# Patient Record
Sex: Female | Born: 1970 | Race: Black or African American | Hispanic: No | Marital: Married | Smoking: Never smoker
Health system: Southern US, Community
[De-identification: ages and names within clinical notes are randomized; demographics above are authoritative.]

## PROBLEM LIST (undated history)

## (undated) DIAGNOSIS — N390 Urinary tract infection, site not specified: Secondary | ICD-10-CM

## (undated) DIAGNOSIS — L820 Inflamed seborrheic keratosis: Secondary | ICD-10-CM

## (undated) HISTORY — DX: Inflamed seborrheic keratosis: L82.0

## (undated) HISTORY — PX: TUBAL LIGATION: SHX77

## (undated) HISTORY — DX: Urinary tract infection, site not specified: N39.0

---

## 2007-12-30 ENCOUNTER — Emergency Department (HOSPITAL_BASED_OUTPATIENT_CLINIC_OR_DEPARTMENT_OTHER): Admission: EM | Admit: 2007-12-30 | Discharge: 2007-12-31 | Payer: Self-pay | Admitting: Emergency Medicine

## 2009-08-07 IMAGING — CR DG CHEST 2V
2 series · 2 of 2 positions shown · non-contrast
Comparison: None.

CLINICAL DATA: Tachycardia.  Shortness of breath.  Dizziness.
Recent marijuana use.

CHEST - 2 VIEW

[w chest pa]
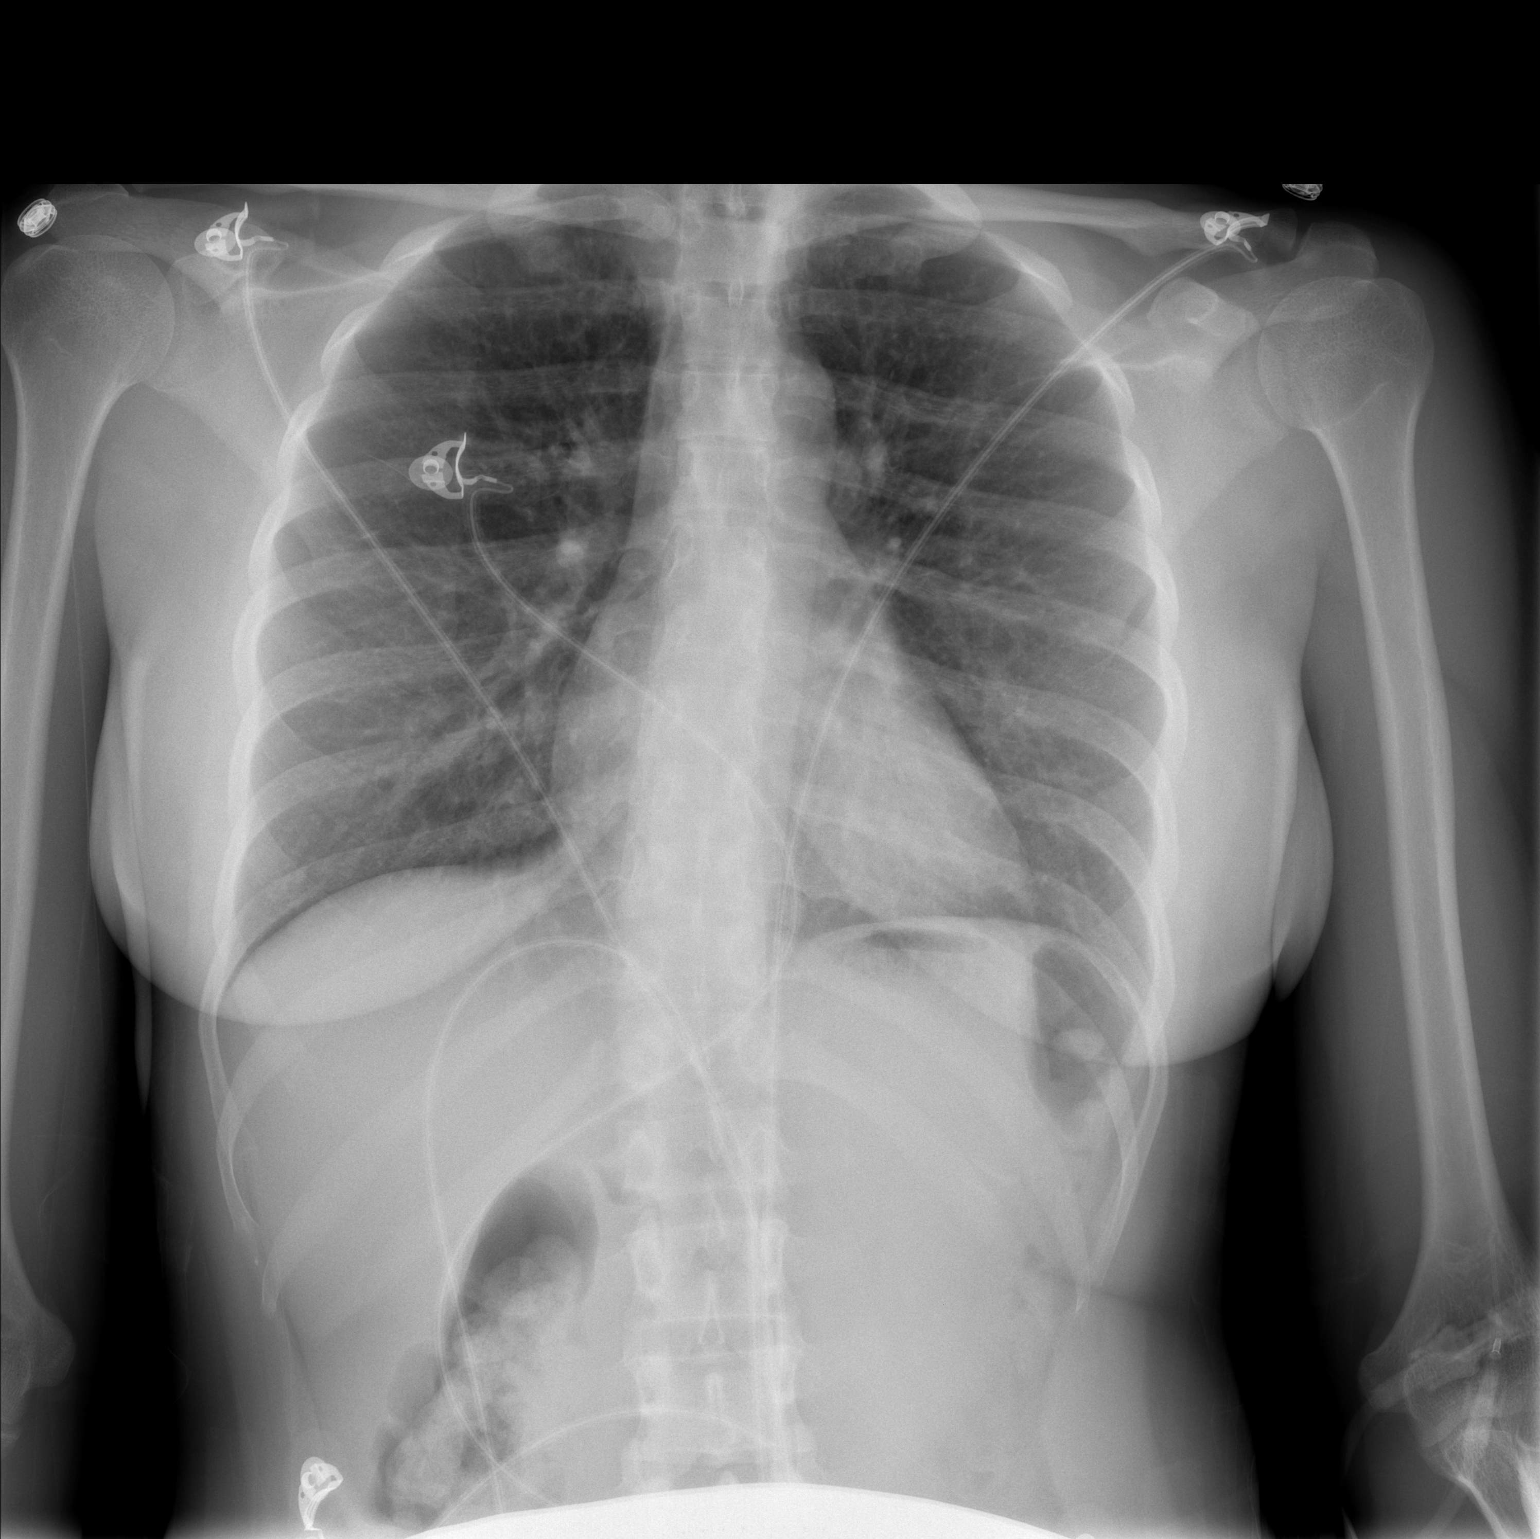

[w chest lat]
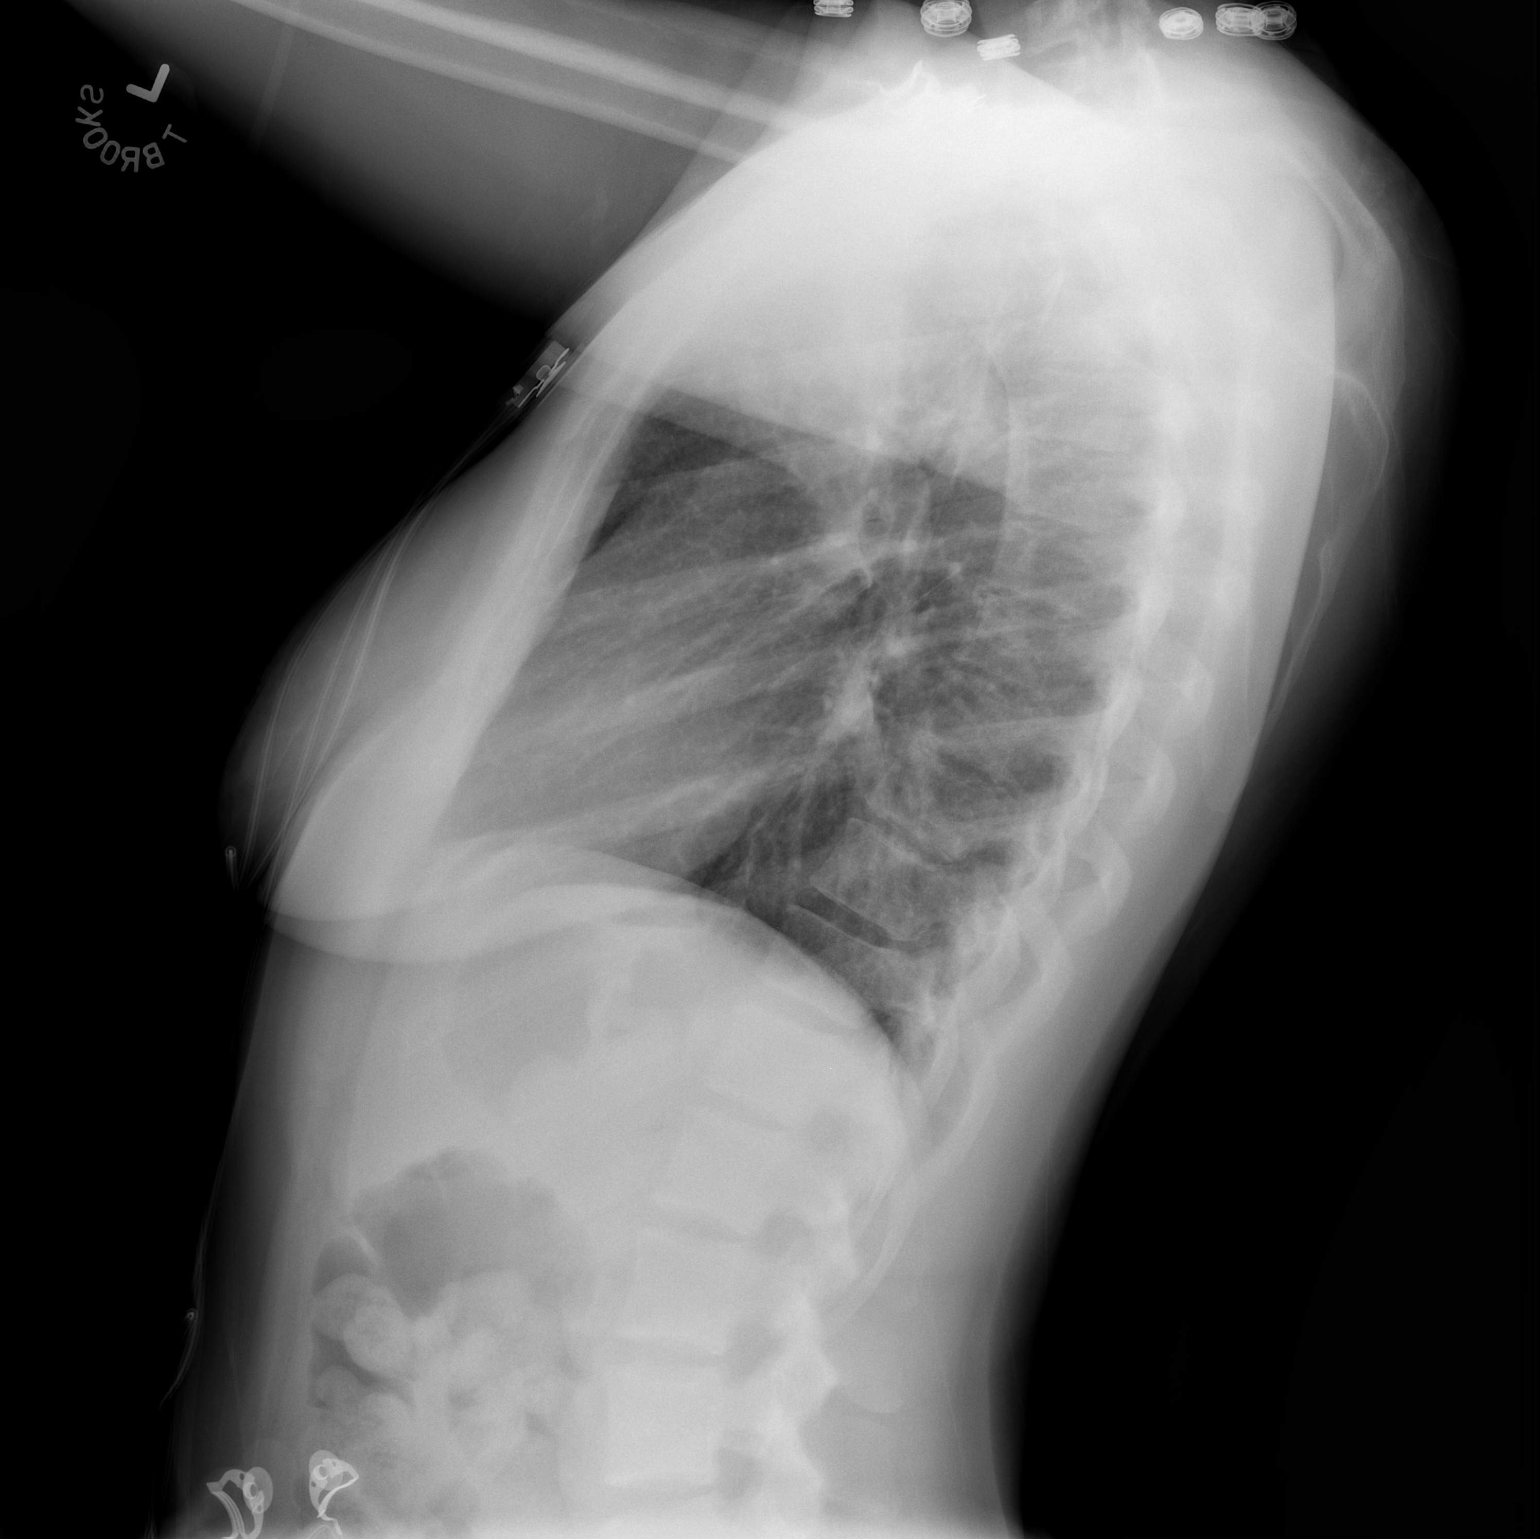

[2 of 2 positions shown; findings below may reference images not displayed]

FINDINGS: Normal sized heart.  Mild diffuse peribronchial
thickening without airspace consolidation.  Mild scoliosis.
IMPRESSION: Mild bronchitic changes.

## 2011-02-25 ENCOUNTER — Inpatient Hospital Stay (INDEPENDENT_AMBULATORY_CARE_PROVIDER_SITE_OTHER)
Admission: RE | Admit: 2011-02-25 | Discharge: 2011-02-25 | Disposition: A | Discharge status: Home / Self Care | Payer: No Typology Code available for payment source | Source: Ambulatory Visit | Attending: Family Medicine | Admitting: Family Medicine

## 2011-02-25 ENCOUNTER — Encounter: Payer: Self-pay | Admitting: Family Medicine

## 2011-02-25 DIAGNOSIS — R300 Dysuria: Secondary | ICD-10-CM

## 2011-02-25 DIAGNOSIS — N3000 Acute cystitis without hematuria: Secondary | ICD-10-CM

## 2011-02-25 LAB — CONVERTED CEMR LAB
Ketones, urine, test strip: NEGATIVE
Protein, U semiquant: NEGATIVE
Specific Gravity, Urine: 1.015
Urobilinogen, UA: 0.2
pH: 5.5

## 2011-04-07 NOTE — Progress Notes (Signed)
Summary: PAINFUL/BURNING URINATION...WSE (rm 2)   Vital Signs:  Patient Profile:   40 Years Old Female CC:      dysuria and heamturia x 3 days LMP:     01/29/2011 Height:     62 inches Weight:      132 pounds O2 Sat:      100 % O2 treatment:    Room Air Temp:     98.6 degrees F oral Pulse rate:   94 / minute Resp:     14 per minute BP sitting:   116 / 80  (left arm) Cuff size:   regular  Vitals Entered By: Lajean Saver RN (February 25, 2011 1:09 PM)  Menstrual History: LMP (date): 01/29/2011 LMP - Character: normal                  Updated Prior Medication List: No Medications Current Allergies: No known allergies History of Present Illness Chief Complaint: dysuria and heamturia x 3 days History of Present Illness:  Subjective:  Patient presents complaining of UTI symptoms for3 days.  Complains of dysuria, frequency, and urgency.  No nocturia.  Had one brief episode of hematuria.  No abnormal vaginal discharge.  No fever.  Had chills/sweats this morning.   No abdominal pain.  No flank pain.  No nausea/vomiting.  Last menstrual period normal.    REVIEW OF SYSTEMS Constitutional Symptoms      Denies fever, chills, night sweats, weight loss, weight gain, and fatigue.  Eyes       Denies change in vision, eye pain, eye discharge, glasses, contact lenses, and eye surgery. Ear/Nose/Throat/Mouth       Denies hearing loss/aids, change in hearing, ear pain, ear discharge, dizziness, frequent runny nose, frequent nose bleeds, sinus problems, sore throat, hoarseness, and tooth pain or bleeding.  Respiratory       Denies dry cough, productive cough, wheezing, shortness of breath, asthma, bronchitis, and emphysema/COPD.  Cardiovascular       Denies murmurs, chest pain, and tires easily with exhertion.    Gastrointestinal       Denies stomach pain, nausea/vomiting, diarrhea, constipation, blood in bowel movements, and indigestion. Genitourniary       Complains of painful  urination.      Denies blood or discharge from vagina, kidney stones, and loss of urinary control. Neurological       Denies paralysis, seizures, and fainting/blackouts. Musculoskeletal       Denies muscle pain, joint pain, joint stiffness, decreased range of motion, redness, swelling, muscle weakness, and gout.  Skin       Denies bruising, unusual mles/lumps or sores, and hair/skin or nail changes.  Psych       Denies mood changes, temper/anger issues, anxiety/stress, speech problems, depression, and sleep problems. Other Comments: Taken Azo   Past History:  Past Medical History: Unremarkable  Past Surgical History: Caesarean section  Social History: Never Smoked Alcohol use-yes Drug use-no Smoking Status:  never Drug Use:  no   Objective:  Appearance:  Patient appears healthy, stated age, and in no acute distress  Eyes:  Pupils are equal, round, and reactive to light and accomodation.  Extraocular movement is intact.  Conjunctivae are not inflamed.  Mouth:  moist mucous membranes  Neck:  Supple.  No adenopathy is present Lungs:  Clear to auscultation.  Breath sounds are equal.  Heart:  Regular rate and rhythm without murmurs, rubs, or gallops.  Abdomen:  Nontender without masses or hepatosplenomegaly.  Bowel sounds are present.  No CVA or flank tenderness.  urinalysis (dipstick): trace blood, + nit, 2+ leuks Assessment New Problems: DYSURIA (ICD-788.1) ACUTE CYSTITIS (ICD-595.0)   Plan New Medications/Changes: SULFAMETHOXAZOLE-TMP DS 800-160 MG TABS (SULFAMETHOXAZOLE-TRIMETHOPRIM) One by mouth two times a day  #10 x 0, 02/25/2011, Donna Christen MD  New Orders: Urinalysis [CPT-81003] T-Culture, Urine [[id number]] New Patient Level III [09811] Planning Comments:   Urine culture pending.  Begin Septra DS for 5 days.  May continue Azo for two days.  Increase fluids Return for worsening symptoms or failure to improve   The patient and/or caregiver has been  counseled thoroughly with regard to medications prescribed including dosage, schedule, interactions, rationale for use, and possible side effects and they verbalize understanding.  Diagnoses and expected course of recovery discussed and will return if not improved as expected or if the condition worsens. Patient and/or caregiver verbalized understanding.  Prescriptions: SULFAMETHOXAZOLE-TMP DS 800-160 MG TABS (SULFAMETHOXAZOLE-TRIMETHOPRIM) One by mouth two times a day  #10 x 0   Entered and Authorized by:   Donna Christen MD   Signed by:   Donna Christen MD on 02/25/2011   Method used:   Print then Give to Patient   RxID:   [id number]   Orders Added: 1)  Urinalysis [CPT-81003] 2)  T-Culture, Urine [[id number]] 3)  New Patient Level III [99203]    Laboratory Results   Urine Tests  Date/Time Received: February 25, 2011 1:27 PM  Date/Time Reported: February 25, 2011 1:27 PM   Routine Urinalysis   Color: orange Appearance: Cloudy Glucose: trace   (Normal Range: Negative) Bilirubin: negative   (Normal Range: Negative) Ketone: negative   (Normal Range: Negative) Spec. Gravity: 1.015   (Normal Range: 1.003-1.035) Blood: trace-lysed   (Normal Range: Negative) pH: 5.5   (Normal Range: 5.0-8.0) Protein: negative   (Normal Range: Negative) Urobilinogen: 0.2   (Normal Range: 0-1) Nitrite: positive   (Normal Range: Negative) Leukocyte Esterace: 2+   (Normal Range: Negative)    Comments: Taken Azo     Appended Document: PAINFUL/BURNING URINATION...WSE (rm 2) Urine culture cancelled per patient request

## 2014-03-25 ENCOUNTER — Encounter: Payer: Self-pay | Admitting: *Deleted

## 2014-03-25 ENCOUNTER — Emergency Department (INDEPENDENT_AMBULATORY_CARE_PROVIDER_SITE_OTHER)
Admission: EM | Admit: 2014-03-25 | Discharge: 2014-03-25 | Disposition: A | Discharge status: Home / Self Care | Payer: No Typology Code available for payment source | Source: Home / Self Care | Attending: Family Medicine | Admitting: Family Medicine

## 2014-03-25 DIAGNOSIS — N3001 Acute cystitis with hematuria: Secondary | ICD-10-CM

## 2014-03-25 LAB — POCT URINALYSIS DIP (MANUAL ENTRY)
Bilirubin, UA: NEGATIVE
Glucose, UA: 100
Ketones, POC UA: NEGATIVE
NITRITE UA: POSITIVE
PH UA: 5.5
Spec Grav, UA: 1.01
UROBILINOGEN UA: 1

## 2014-03-25 MED ORDER — SULFAMETHOXAZOLE-TRIMETHOPRIM 800-160 MG PO TABS: 2.0000 | ORAL_TABLET | Freq: Two times a day (BID) | ORAL | Status: DC

## 2014-03-25 NOTE — ED Notes (Signed)
Pt states she started having burning urination with urgency and frequency last night.  She has taken AZO for pain.

## 2014-03-25 NOTE — ED Provider Notes (Signed)
Shardai Star is a 43 y.o. female who presents to Urgent Care today for urinary urgency frequency and dysuria. Symptoms present starting yesterday evening. Patient denies any vaginal discharge fevers chills nausea vomiting or diarrhea. Her current symptoms are consistent with prior episodes of urinary tract infection. She notes previously she has done well with Bactrim and would like to use this medication today if possible.   History reviewed. No pertinent past medical history. Past Surgical History  Procedure Laterality Date  . Cesarean section     History  Substance Use Topics  . Smoking status: Never Smoker   . Smokeless tobacco: Never Used  . Alcohol Use: Yes   ROS as above Medications: No current facility-administered medications for this encounter.   Current Outpatient Prescriptions  Medication Sig Dispense Refill  . phenazopyridine (PYRIDIUM) 95 MG tablet Take 95 mg by mouth 3 (three) times daily as needed for pain.    Marland Kitchen sulfamethoxazole-trimethoprim (SEPTRA DS) 800-160 MG per tablet Take 2 tablets by mouth 2 (two) times daily. 28 tablet 0   No Known Allergies   Exam:  BP 125/85 mmHg  Pulse 95  Temp(Src) 98 F (36.7 C) (Oral)  Ht 5\' 2"  (1.575 m)  Wt 145 lb 8 oz (65.998 kg)  BMI 26.61 kg/m2  SpO2 100%  LMP 03/14/2014 Gen: Well NAD HEENT: ,  MMM Lungs: Normal work of breathing. CTABL Heart: RRR no MRG Abd: NABS, Soft. Nondistended, Nontender no CV angle tenderness to percussion Exts: Brisk capillary refill, warm and well perfused.   Results for orders placed or performed during the hospital encounter of 03/25/14 (from the past 24 hour(s))  POCT urinalysis dipstick (new)     Status: None   Collection Time: 03/25/14  2:34 PM  Result Value Ref Range   Color, UA orange    Clarity, UA cloudy    Glucose, UA =100    Bilirubin, UA negative    Bilirubin, UA negative    Spec Grav, UA 1.010    Blood, UA trace-intact    pH, UA 5.5    Protein Ur, POC trace    Urobilinogen, UA 1.0    Nitrite, UA Positive    Leukocytes, UA large (3+)    No results found.  Assessment and Plan: 43 y.o. female with urinary tract infection. Culture pending. Treatment with Bactrim.  Discussed warning signs or symptoms. Please see discharge instructions. Patient expresses understanding.     Gregor Hams, MD 03/25/14 [phone]

## 2014-03-25 NOTE — Discharge Instructions (Signed)
Thank you for coming in today. Make sure to use backup birth control with taking this medication   Urinary Tract Infection Urinary tract infections (UTIs) can develop anywhere along your urinary tract. Your urinary tract is your body's drainage system for removing wastes and extra water. Your urinary tract includes two kidneys, two ureters, a bladder, and a urethra. Your kidneys are a pair of bean-shaped organs. Each kidney is about the size of your fist. They are located below your ribs, one on each side of your spine. CAUSES Infections are caused by microbes, which are microscopic organisms, including fungi, viruses, and bacteria. These organisms are so small that they can only be seen through a microscope. Bacteria are the microbes that most commonly cause UTIs. SYMPTOMS  Symptoms of UTIs may vary by age and gender of the patient and by the location of the infection. Symptoms in young women typically include a frequent and intense urge to urinate and a painful, burning feeling in the bladder or urethra during urination. Older women and men are more likely to be tired, shaky, and weak and have muscle aches and abdominal pain. A fever may mean the infection is in your kidneys. Other symptoms of a kidney infection include pain in your back or sides below the ribs, nausea, and vomiting. DIAGNOSIS To diagnose a UTI, your caregiver will ask you about your symptoms. Your caregiver also will ask to provide a urine sample. The urine sample will be tested for bacteria and white blood cells. White blood cells are made by your body to help fight infection. TREATMENT  Typically, UTIs can be treated with medication. Because most UTIs are caused by a bacterial infection, they usually can be treated with the use of antibiotics. The choice of antibiotic and length of treatment depend on your symptoms and the type of bacteria causing your infection. HOME CARE INSTRUCTIONS  If you were prescribed antibiotics, take  them exactly as your caregiver instructs you. Finish the medication even if you feel better after you have only taken some of the medication.  Drink enough water and fluids to keep your urine clear or pale yellow.  Avoid caffeine, tea, and carbonated beverages. They tend to irritate your bladder.  Empty your bladder often. Avoid holding urine for long periods of time.  Empty your bladder before and after sexual intercourse.  After a bowel movement, women should cleanse from front to back. Use each tissue only once. SEEK MEDICAL CARE IF:   You have back pain.  You develop a fever.  Your symptoms do not begin to resolve within 3 days. SEEK IMMEDIATE MEDICAL CARE IF:   You have severe back pain or lower abdominal pain.  You develop chills.  You have nausea or vomiting.  You have continued burning or discomfort with urination. MAKE SURE YOU:   Understand these instructions.  Will watch your condition.  Will get help right away if you are not doing well or get worse. Document Released: 01/29/2005 Document Revised: 10/21/2011 Document Reviewed: 05/30/2011 [facility] Patient Information 2015 [place], [place]. This information is not intended to replace advice given to you by your health care provider. Make sure you discuss any questions you have with your health care provider.

## 2014-03-28 ENCOUNTER — Telehealth (HOSPITAL_COMMUNITY): Payer: Self-pay | Admitting: Family Medicine

## 2014-03-28 LAB — URINE CULTURE

## 2014-03-28 MED ORDER — FLUCONAZOLE 150 MG PO TABS: 150.0000 mg | ORAL_TABLET | Freq: Once | ORAL | Status: DC

## 2014-03-28 MED ORDER — CEPHALEXIN 500 MG PO CAPS: 500.0000 mg | ORAL_CAPSULE | Freq: Three times a day (TID) | ORAL | Status: DC

## 2014-03-28 NOTE — ED Notes (Signed)
Urine culture shows Escherichia coli resistant to Bactrim. Keflex called in. Patient notified.  Gregor Hams, MD 03/28/14 [phone]

## 2014-07-14 LAB — HM PAP SMEAR: HM PAP: NEGATIVE

## 2016-12-30 DIAGNOSIS — N390 Urinary tract infection, site not specified: Secondary | ICD-10-CM

## 2016-12-30 HISTORY — DX: Urinary tract infection, site not specified: N39.0

## 2017-03-16 ENCOUNTER — Ambulatory Visit (INDEPENDENT_AMBULATORY_CARE_PROVIDER_SITE_OTHER): Payer: 59 | Admitting: Physician Assistant

## 2017-03-16 ENCOUNTER — Encounter: Payer: Self-pay | Admitting: Physician Assistant

## 2017-03-16 VITALS — BP 115/77 | HR 85 | Ht 62.0 in | Wt 147.0 lb

## 2017-03-16 DIAGNOSIS — Z0000 Encounter for general adult medical examination without abnormal findings: Secondary | ICD-10-CM

## 2017-03-16 DIAGNOSIS — Z1321 Encounter for screening for nutritional disorder: Secondary | ICD-10-CM

## 2017-03-16 DIAGNOSIS — E559 Vitamin D deficiency, unspecified: Secondary | ICD-10-CM | POA: Diagnosis not present

## 2017-03-16 DIAGNOSIS — L820 Inflamed seborrheic keratosis: Secondary | ICD-10-CM

## 2017-03-16 DIAGNOSIS — Z7689 Persons encountering health services in other specified circumstances: Secondary | ICD-10-CM | POA: Diagnosis not present

## 2017-03-16 DIAGNOSIS — Z13220 Encounter for screening for lipoid disorders: Secondary | ICD-10-CM

## 2017-03-16 DIAGNOSIS — Z130 Encounter for screening for diseases of the blood and blood-forming organs and certain disorders involving the immune mechanism: Secondary | ICD-10-CM | POA: Diagnosis not present

## 2017-03-16 DIAGNOSIS — Z1329 Encounter for screening for other suspected endocrine disorder: Secondary | ICD-10-CM

## 2017-03-16 DIAGNOSIS — D225 Melanocytic nevi of trunk: Secondary | ICD-10-CM

## 2017-03-16 DIAGNOSIS — Z1331 Encounter for screening for depression: Secondary | ICD-10-CM | POA: Diagnosis not present

## 2017-03-16 DIAGNOSIS — Z1231 Encounter for screening mammogram for malignant neoplasm of breast: Secondary | ICD-10-CM

## 2017-03-16 DIAGNOSIS — K5900 Constipation, unspecified: Secondary | ICD-10-CM | POA: Diagnosis not present

## 2017-03-16 HISTORY — DX: Inflamed seborrheic keratosis: L82.0

## 2017-03-16 NOTE — Progress Notes (Signed)
HPI:                                                                Lauren Walsh is a 46 y.o. female who presents to [facility] today to establish care  Current concerns include: none  Past Medical History:  Diagnosis Date  . Recurrent UTI 12/30/2016   Past Surgical History:  Procedure Laterality Date  . CESAREAN SECTION    . TUBAL LIGATION     Social History   Tobacco Use  . Smoking status: Never Smoker  . Smokeless tobacco: Never Used  Substance Use Topics  . Alcohol use: Yes    Alcohol/week: 0.6 oz    Types: 1 Glasses of wine per week   family history includes Hypertension in her mother; Prostate cancer in her father.  ROS: Review of Systems  Constitutional: Positive for malaise/fatigue.  HENT: Negative.   Eyes: Positive for blurred vision (wears corrective lenses).  Respiratory: Negative.   Cardiovascular: Negative.   Gastrointestinal: Positive for constipation.  Genitourinary:       + recurrent UTI  Musculoskeletal: Negative.   Skin: Negative.   Neurological: Negative.   Endo/Heme/Allergies: Negative.   Psychiatric/Behavioral: Negative.      Medications: No current outpatient medications on file.   No current facility-administered medications for this visit.    No Known Allergies     Objective:  BP 115/77   Pulse 85   Ht 5\' 2"  (1.575 m)   Wt 147 lb (66.7 kg)   LMP 03/10/2017   BMI 26.89 kg/m  General Appearance:  Alert, cooperative, no distress, appropriate for age                            Head:  Normocephalic, without obvious abnormality                             Eyes:  PERRL, EOM's intact, conjunctiva and cornea clear, wearing contact lenses                             Ears:  TM pearly gray color and semitransparent, external ear canals normal, both ears                            Nose:  Nares symmetrical                          Throat:  Lips, tongue, and mucosa are moist, pink, and  intact; good dentition, wearing invisalign braces                             Neck:  Supple; symmetrical, trachea midline, no adenopathy; thyroid: no enlargement, symmetric, no tenderness/mass/nodules                             Back:  Symmetrical, no curvature, ROM normal  Chest/Breast:  deferred                           Lungs:  Clear to auscultation bilaterally, respirations unlabored                             Heart:  regular rate & normal rhythm, S1 and S2 normal, no murmurs, rubs, or gallops                     Abdomen:  Soft, non-tender, no mass or organomegaly              Genitourinary:  deferred         Musculoskeletal:  Tone and strength strong and symmetrical, all extremities; no joint pain or edema, normal gait and station                                       Lymphatic:  No adenopathy             Skin/Hair/Nails:  Skin warm, dry and intact, no rashes, scaly hyperpigmented nevus measuring approximately 0.5 cm on left upper back                    Neurologic:  Alert and oriented x3, no cranial nerve deficits, DTR's intact, sensation grossly intact, normal gait and station, no tremor Psych: well-groomed, cooperative, good eye contact, euthymic mood, affect mood-congruent, speech is articulate, and thought processes clear and goal-directed   Depression screen [facility] 2/9 03/16/2017  Decreased Interest 2  Down, Depressed, Hopeless 1  PHQ - 2 Score 3  Altered sleeping 2  Tired, decreased energy 3  Change in appetite 3  Feeling bad or failure about yourself  0  Trouble concentrating 0  Moving slowly or fidgety/restless 0  Suicidal thoughts 0  PHQ-9 Score 11     No results found for this or any previous visit (from the past 72 hour(s)). No results found.    Assessment and Plan: 46 y.o. female with   1. Encounter to establish care - reviewed PMH, PSH, PFH, medications and allergies  2. Breast cancer screening by mammogram - MM SCREENING BREAST TOMO BILATERAL;  Future  3. Encounter for annual physical exam - Pap smear UTD per patient, requesting records from [place] - mammogram ordered - Tdap UTD per patient - declined influenza - positive depression screening - declines STI screening - fasting labs per orders - CBC - Vitamin B12 - Lipid Panel w/reflex Direct LDL - TSH - VITAMIN D 25 Hydroxy (Vit-D Deficiency, Fractures)  4. Screening for blood disease - CBC - Ferritin  5. Encounter for vitamin deficiency screening - Vitamin B12 - VITAMIN D 25 Hydroxy (Vit-D Deficiency, Fractures)  6. Screening for thyroid disorder - TSH  7. Screening for lipid disorders - Lipid Panel w/reflex Direct LDL  8. Constipation, unspecified constipation type - high fiber diet, increase water intake, regular exercise, follow-up in 2 months  9. Positive depression screening - PHQ9 score 11, no acute safety issues - patient denies hx of depression. Reports she does not feel depressed, but does endorse fatigue. Recommended follow-up in 1-4 weeks to address in more detail  10. Melanocytic nevus of trunk - no evidence of atypia, monitor ABCD's, annual skin exam  Patient education and anticipatory guidance given  Patient agrees with treatment plan Follow-up in 1 year for CPE with fasting labs or sooner as needed if symptoms worsen or fail to improve  Darlyne Russian PA-C

## 2017-03-16 NOTE — Patient Instructions (Addendum)
I have also ordered fasting labs. The lab is a walk-in open M-F 7:30a-4:30p (closed 12:30-1:30p). Nothing to eat or drink after midnight or at least 8 hours before your blood draw. You can have water and your medications.    Physical Activity Recommendations for modifying lipids and lowering blood pressure Engage in aerobic physical activity to reduce LDL-cholesterol, non-HDL-cholesterol, and blood pressure  Frequency: 3-4 sessions per week  Intensity: moderate to vigorous  Duration: 40 minutes on average  Physical Activity Recommendations for secondary prevention 1. Aerobic exercise  Frequency: 3-5 sessions per week  Intensity: 50-80% capacity  Duration: 20 - 60 minutes  Examples: walking, treadmill, cycling, rowing, stair climbing, and arm/leg ergometry  2. Resistance exercise  Frequency: 2-3 sessions per week  Intensity: 10-15 repetitions/set to moderate fatigue  Duration: 1-3 sets of 8-10 upper and lower body exercises  Examples: calisthenics, elastic bands, cuff/hand weights, dumbbels, free weights, wall pulleys, and weight machines  Heart-Healthy Lifestyle  Eating a diet rich in vegetables, fruits and whole grains: also includes low-fat dairy products, poultry, fish, legumes, and nuts; limit intake of sweets, sugar-sweetened beverages and red meats  Getting regular exercise  Maintaining a healthy weight  Not smoking or getting help quitting  Staying on top of your health; for some people, lifestyle changes alone may not be enough to prevent a heart attack or stroke. In these cases, taking a statin at the right dose will most likely be necessary    High-Fiber Diet Fiber, also called dietary fiber, is a type of carbohydrate found in fruits, vegetables, whole grains, and beans. A high-fiber diet can have many health benefits. Your health care provider may recommend a high-fiber diet to help:  Prevent constipation. Fiber can make your bowel movements more  regular.  Lower your cholesterol.  Relieve hemorrhoids, uncomplicated diverticulosis, or irritable bowel syndrome.  Prevent overeating as part of a weight-loss plan.  Prevent heart disease, type 2 diabetes, and certain cancers.  What is my plan? The recommended daily intake of fiber includes:  38 grams for men under age 73.  30 grams for men over age 67.  65 grams for women under age 43.  90 grams for women over age 13.  You can get the recommended daily intake of dietary fiber by eating a variety of fruits, vegetables, grains, and beans. Your health care provider may also recommend a fiber supplement if it is not possible to get enough fiber through your diet. What do I need to know about a high-fiber diet?  Fiber supplements have not been widely studied for their effectiveness, so it is better to get fiber through food sources.  Always check the fiber content on thenutrition facts label of any prepackaged food. Look for foods that contain at least 5 grams of fiber per serving.  Ask your dietitian if you have questions about specific foods that are related to your condition, especially if those foods are not listed in the following section.  Increase your daily fiber consumption gradually. Increasing your intake of dietary fiber too quickly may cause bloating, cramping, or gas.  Drink plenty of water. Water helps you to digest fiber. What foods can I eat? Grains Whole-grain breads. Multigrain cereal. Oats and oatmeal. Brown rice. Barley. Bulgur wheat. [place]. Bran muffins. Popcorn. Rye wafer crackers. Vegetables Sweet potatoes. Spinach. Kale. Artichokes. Cabbage. Broccoli. Green peas. Carrots. Squash. Fruits Berries. Pears. Apples. Oranges. Avocados. Prunes and raisins. Dried figs. Meats and Other Protein Sources Navy, kidney, pinto, and soy beans. Split  peas. Lentils. Nuts and seeds. Dairy Fiber-fortified yogurt. Beverages Fiber-fortified soy milk. Fiber-fortified  orange juice. Other Fiber bars. The items listed above may not be a complete list of recommended foods or beverages. Contact your dietitian for more options. What foods are not recommended? Grains White bread. Pasta made with refined flour. White rice. Vegetables Fried potatoes. Canned vegetables. Well-cooked vegetables. Fruits Fruit juice. Cooked, strained fruit. Meats and Other Protein Sources Fatty cuts of meat. Fried Sales executive or fried fish. Dairy Milk. Yogurt. Cream cheese. Sour cream. Beverages Soft drinks. Other Cakes and pastries. Butter and oils. The items listed above may not be a complete list of foods and beverages to avoid. Contact your dietitian for more information. What are some tips for including high-fiber foods in my diet?  Eat a wide variety of high-fiber foods.  Make sure that half of all grains consumed each day are whole grains.  Replace breads and cereals made from refined flour or white flour with whole-grain breads and cereals.  Replace white rice with brown rice, bulgur wheat, or millet.  Start the day with a breakfast that is high in fiber, such as a cereal that contains at least 5 grams of fiber per serving.  Use beans in place of meat in soups, salads, or pasta.  Eat high-fiber snacks, such as berries, raw vegetables, nuts, or popcorn. This information is not intended to replace advice given to you by your health care provider. Make sure you discuss any questions you have with your health care provider. Document Released: 04/21/2005 Document Revised: 09/27/2015 Document Reviewed: 10/04/2013 Elsevier Interactive Patient Education  2017 [employer].   [facility] Content in Foods See the following list for the dietary fiber content of some common foods. High-fiber foods High-fiber foods contain 4 grams or more (4g or more) of fiber per serving. They include:  Artichoke (fresh) - 1 medium has 10.3g of fiber.  Baked beans, plain or vegetarian  (canned) -  cup has 5.2g of fiber.  Blackberries or raspberries (fresh) -  cup has 4g of fiber.  Bran cereal -  cup has 8.6g of fiber.  Bulgur (cooked) -  cup has 4g of fiber.  Kidney beans (canned) -  cup has 6.8g of fiber.  Lentils (cooked) -  cup has 7.8g of fiber.  Pear (fresh) - 1 medium has 5.1g of fiber.  Peas (frozen) -  cup has 4.4g of fiber.  Pinto beans (canned) -  cup has 5.5g of fiber.  Pinto beans (dried and cooked) -  cup has 7.7g of fiber.  Potato with skin (baked) - 1 medium has 4.4g of fiber.  Quinoa (cooked) -  cup has 5g of fiber.  Soybeans (canned, frozen, or fresh) -  cup has 5.1g of fiber.  Moderate-fiber foods Moderate-fiber foods contain 1-4 grams (1-4g) of fiber per serving. They include:  Almonds - 1 oz. has 3.5g of fiber.  Apple with skin - 1 medium has 3.3g of fiber.  Applesauce, sweetened -  cup has 1.5g of fiber.  Bagel, plain - one 4-inch (10-cm) bagel has 2g of fiber.  Banana - 1 medium has 3.1g of fiber.  Broccoli (cooked) -  cup has 2.5g of fiber.  Carrots (cooked) -  cup has 2.3g of fiber.  Corn (canned or frozen) -  cup has 2.1g of fiber.  Corn tortilla - one 6-inch (15-cm) tortilla has 1.5g of fiber.  Green beans (canned) -  cup has 2g of fiber.  Instant oatmeal -  cup has  about 2g of fiber.  Long-grain brown rice (cooked) - 1 cup has 3.5g of fiber.  Macaroni, enriched (cooked) - 1 cup has 2.5g of fiber.  Melon - 1 cup has 1.4g of fiber.  Multigrain cereal -  cup has about 2-4g of fiber.  Orange - 1 small has 3.1g of fiber.  Potatoes, mashed -  cup has 1.6g of fiber.  Raisins - 1/4 cup has 1.6g of fiber.  Squash -  cup has 2.9g of fiber.  Sunflower seeds -  cup has 1.1g of fiber.  Tomato - 1 medium has 1.5g of fiber.  Vegetable or soy patty - 1 has 3.4g of fiber.  Whole-wheat bread - 1 slice has 2g of fiber.  Whole-wheat spaghetti -  cup has 3.2g of fiber.  Low-fiber  foods Low-fiber foods contain less than 1 gram (less than 1g) of fiber per serving. They include:  Egg - 1 large.  Flour tortilla - one 6-inch (15-cm) tortilla.  Fruit juice -  cup.  Lettuce - 1 cup.  Meat, poultry, or fish - 1 oz.  Milk - 1 cup.  Spinach (raw) - 1 cup.  White bread - 1 slice.  White rice -  cup.  Yogurt -  cup.  Actual amounts of fiber in foods may be different depending on processing. Talk with your dietitian about how much fiber you need in your diet. This information is not intended to replace advice given to you by your health care provider. Make sure you discuss any questions you have with your health care provider. Document Released: 09/07/2006 Document Revised: 09/27/2015 Document Reviewed: 06/14/2015 Elsevier Interactive Patient Education  2018 [employer].

## 2017-03-17 DIAGNOSIS — E559 Vitamin D deficiency, unspecified: Secondary | ICD-10-CM | POA: Insufficient documentation

## 2017-03-17 LAB — CBC
HEMATOCRIT: 37.8 % (ref 35.0–45.0)
HEMOGLOBIN: 12.1 g/dL (ref 11.7–15.5)
MCH: 27.1 pg (ref 27.0–33.0)
MCHC: 32 g/dL (ref 32.0–36.0)
MCV: 84.8 fL (ref 80.0–100.0)
MPV: 10.9 fL (ref 7.5–12.5)
Platelets: 283 10*3/uL (ref 140–400)
RBC: 4.46 10*6/uL (ref 3.80–5.10)
RDW: 12.1 % (ref 11.0–15.0)
WBC: 7.4 10*3/uL (ref 3.8–10.8)

## 2017-03-17 LAB — LIPID PANEL W/REFLEX DIRECT LDL
CHOL/HDL RATIO: 2.8 (calc) (ref <5.0)
Cholesterol: 189 mg/dL (ref <200)
HDL: 68 mg/dL (ref >50)
LDL CHOLESTEROL (CALC): 102 mg/dL — AB
NON-HDL CHOLESTEROL (CALC): 121 mg/dL (ref <130)
Triglycerides: 98 mg/dL (ref <150)

## 2017-03-17 LAB — VITAMIN D 25 HYDROXY (VIT D DEFICIENCY, FRACTURES): Vit D, 25-Hydroxy: 16 ng/mL — ABNORMAL LOW (ref 30–100)

## 2017-03-17 LAB — VITAMIN B12: Vitamin B-12: 311 pg/mL (ref 200–1100)

## 2017-03-17 LAB — FERRITIN: FERRITIN: 54 ng/mL (ref 10–232)

## 2017-03-17 LAB — TSH: TSH: 0.4 mIU/L

## 2017-03-17 MED ORDER — VITAMIN D (ERGOCALCIFEROL) 1.25 MG (50000 UNIT) PO CAPS: 50000.0000 [IU] | ORAL_CAPSULE | ORAL | 0 refills | Status: DC

## 2017-03-17 NOTE — Progress Notes (Signed)
Labs look great Vitamin D is low. Sent prescription for once weekly high dose vitamin D for 8 weeks. Once she completes it, she should take vitamin D3 2000 units daily

## 2017-03-17 NOTE — Addendum Note (Signed)
Addended by: Nelson Chimes E on: 03/17/2017 01:59 PM   Modules accepted: Orders

## 2017-04-24 ENCOUNTER — Encounter: Payer: Self-pay | Admitting: Physician Assistant

## 2017-04-24 ENCOUNTER — Ambulatory Visit (INDEPENDENT_AMBULATORY_CARE_PROVIDER_SITE_OTHER): Payer: 59 | Admitting: Sports Medicine

## 2017-04-24 DIAGNOSIS — L989 Disorder of the skin and subcutaneous tissue, unspecified: Secondary | ICD-10-CM

## 2017-04-24 NOTE — Assessment & Plan Note (Signed)
Shave biopsy as above. Suspect irritated seborrheic keratosis versus keratoacanthoma versus verruca. Path results to follow.

## 2017-04-24 NOTE — Progress Notes (Signed)
  Subjective:    CC: Skin lesion  HPI: For the past several months this pleasant 46 year old female has noted a skin lesion on the left upper back, it is irritating, gets in the way of her bra strap.  Symptoms are persistent.  Past medical history:  Negative.  See flowsheet/record as well for more information.  Surgical history: Negative.  See flowsheet/record as well for more information.  Family history: Negative.  See flowsheet/record as well for more information.  Social history: Negative.  See flowsheet/record as well for more information.  Allergies, and medications have been entered into the medical record, reviewed, and no changes needed.   (To billers/coders, pertinent past medical, social, surgical, family history can be found in problem list, if problem list is marked as reviewed then this indicates that past medical, social, surgical, family history was also reviewed)  Review of Systems: No fevers, chills, night sweats, weight loss, chest pain, or shortness of breath.   Objective:    General: Well Developed, well nourished, and in no acute distress.  Neuro: Alert and oriented x3, extra-ocular muscles intact, sensation grossly intact.  HEENT: Normocephalic, atraumatic, pupils equal round reactive to light, neck supple, no masses, no lymphadenopathy, thyroid nonpalpable.  Skin: Warm and dry, no rashes.  There is a 9 mm exophytic dark lesion on the left upper back. Cardiac: Regular rate and rhythm, no murmurs rubs or gallops, no lower extremity edema.  Respiratory: Clear to auscultation bilaterally. Not using accessory muscles, speaking in full sentences.  Procedure:  Excision of 9 mm exophytic skin lesion on the left upper back Risks, benefits, and alternatives explained and consent obtained. Time out conducted. Surface prepped with alcohol. 3cc lidocaine with epinephine infiltrated in a field block. Adequate anesthesia ensured. Area prepped and draped in a sterile  fashion. Excision performed with: Dermal blade used to shave the skin lesion into the intradermal region, I then used a Hyfrecator to achieve hemostasis. Hemostasis achieved. Pt stable.  Impression and Recommendations:    Skin lesion Shave biopsy as above. Suspect irritated seborrheic keratosis versus keratoacanthoma versus verruca. Path results to follow.  ___________________________________________ Gwen Her. Dianah Field, M.D., ABFM., CAQSM. Primary Care and [place] Instructor of [place] [facility]

## 2017-04-24 NOTE — Progress Notes (Signed)
HPI:                                                                Lauren Walsh is a 46 y.o. female who presents to [facility]: [facility] today for abnormal nevus  Patient reports a nevus on her upper left back that has been present for years. In the last week she has had itching and some bleeding. Denies personal or family history of skin cancer.   Past Medical History:  Diagnosis Date  . Recurrent UTI 12/30/2016   Past Surgical History:  Procedure Laterality Date  . CESAREAN SECTION    . TUBAL LIGATION     Social History   Tobacco Use  . Smoking status: Never Smoker  . Smokeless tobacco: Never Used  Substance Use Topics  . Alcohol use: Yes    Alcohol/week: 0.6 oz    Types: 1 Glasses of wine per week   family history includes Hypertension in her mother; Prostate cancer in her father.  ROS: negative except as noted in the HPI  Medications: Current Outpatient Medications  Medication Sig Dispense Refill  . Vitamin D, Ergocalciferol, (DRISDOL) 50000 units CAPS capsule Take 1 capsule (50,000 Units total) every 7 (seven) days by mouth. Take for 8 total doses(weeks) 8 capsule 0   No current facility-administered medications for this visit.    No Known Allergies     Objective:  There were no vitals taken for this visit. Gen:  alert, not ill-appearing, no distress, appropriate for age 55: head normocephalic without obvious abnormality, conjunctiva and cornea clear, trachea midline Pulm: Normal work of breathing, normal phonation, clear to auscultation bilaterally, no wheezes, rales or rhonchi CV: Normal rate, regular rhythm, s1 and s2 distinct, no murmurs, clicks or rubs  Neuro: alert and oriented x 3, no tremor MSK: extremities atraumatic, normal gait and station Skin: intact, no rashes on exposed skin, no jaundice, no cyanosis  Psych: well-groomed, cooperative, good eye contact, euthymic mood, affect  mood-congruent, speech  is articulate, and thought processes clear and goal-directed  Depression screen [facility] 2/9 03/16/2017  Decreased Interest 2  Down, Depressed, Hopeless 1  PHQ - 2 Score 3  Altered sleeping 2  Tired, decreased energy 3  Change in appetite 3  Feeling bad or failure about yourself  0  Trouble concentrating 0  Moving slowly or fidgety/restless 0  Suicidal thoughts 0  PHQ-9 Score 11     No results found for this or any previous visit (from the past 72 hour(s)). No results found.    Assessment and Plan: 46 y.o. female with Skin lesion  Patient will have definitive treatment by Dr. Darene Lamer. Today.  Patient education and anticipatory guidance given Patient agrees with treatment plan Follow-up as needed if symptoms worsen or fail to improve  Darlyne Russian PA-C

## 2017-04-29 ENCOUNTER — Encounter: Payer: Self-pay | Admitting: Physician Assistant

## 2017-05-01 ENCOUNTER — Ambulatory Visit: Payer: 59 | Admitting: Physician Assistant

## 2017-05-01 ENCOUNTER — Telehealth: Payer: Self-pay | Admitting: Family Medicine

## 2017-05-01 NOTE — Telephone Encounter (Signed)
Skin pathology results show irritated Seborehic Keratosis. This is not cancerous and should not come back after removal.

## 2017-05-01 NOTE — Telephone Encounter (Signed)
Patient has been informed of results. Lauren Walsh,CMA

## 2017-05-15 ENCOUNTER — Encounter: Payer: Self-pay | Admitting: Physician Assistant

## 2022-08-12 NOTE — Progress Notes (Unsigned)
     Established patient visit   Patient: Lauren Walsh   DOB: January 05, 1971   52 y.o. Female  MRN: [id number] Visit Date: 08/13/2022  Today's healthcare provider: Charlton Amor, DO   No chief complaint on file.   SUBJECTIVE   No chief complaint on file.  HPI    Review of Systems     No outpatient medications have been marked as taking for the 08/13/22 encounter (Appointment) with Charlton Amor, DO.    OBJECTIVE    There were no vitals taken for this visit.  Physical Exam   {Show previous labs (optional):23736}    ASSESSMENT & PLAN    Problem List Items Addressed This Visit   None   No follow-ups on file.      No orders of the defined types were placed in this encounter.   No orders of the defined types were placed in this encounter.    Charlton Amor, DO  [facility] at [facility] [phone] (phone) [phone] (fax)  [facility]

## 2022-08-13 ENCOUNTER — Encounter: Payer: Self-pay | Admitting: Family Medicine

## 2022-08-13 ENCOUNTER — Ambulatory Visit (INDEPENDENT_AMBULATORY_CARE_PROVIDER_SITE_OTHER): Payer: 59 | Admitting: Family Medicine

## 2022-08-13 VITALS — BP 162/101 | HR 91 | Temp 98.9°F | Ht 62.0 in | Wt 176.1 lb

## 2022-08-13 DIAGNOSIS — G43009 Migraine without aura, not intractable, without status migrainosus: Secondary | ICD-10-CM | POA: Diagnosis not present

## 2022-08-13 DIAGNOSIS — R062 Wheezing: Secondary | ICD-10-CM | POA: Diagnosis not present

## 2022-08-13 DIAGNOSIS — R030 Elevated blood-pressure reading, without diagnosis of hypertension: Secondary | ICD-10-CM | POA: Diagnosis not present

## 2022-08-13 DIAGNOSIS — T7840XA Allergy, unspecified, initial encounter: Secondary | ICD-10-CM | POA: Diagnosis not present

## 2022-08-13 MED ORDER — ALBUTEROL SULFATE (2.5 MG/3ML) 0.083% IN NEBU: 2.5000 mg | INHALATION_SOLUTION | Freq: Four times a day (QID) | RESPIRATORY_TRACT | 1 refills | Status: DC | PRN

## 2022-08-13 NOTE — Assessment & Plan Note (Signed)
-   referral sent to allergy for skin testing. Discussed getting off claritin prior to testing

## 2022-08-13 NOTE — Assessment & Plan Note (Addendum)
Pt notes some wheezing at night. Will give albuterol inhaler and do pfts on patient--she has strong hx of allergies and does not take claritin regularly and I wonder if wheezing is from allergy to dust mites at home. No wheezing heard on exam today. Recommend taking claritin nightly and getting allergy testing. Given albuterol inhaler. Differential could also be reflux, if no change will need ppi

## 2022-08-13 NOTE — Assessment & Plan Note (Signed)
BP elevated at 162/101 - repeat bp prior to leaving.

## 2022-08-14 DIAGNOSIS — G43009 Migraine without aura, not intractable, without status migrainosus: Secondary | ICD-10-CM | POA: Insufficient documentation

## 2022-08-14 MED ORDER — SUMATRIPTAN SUCCINATE 50 MG PO TABS: 50.0000 mg | ORAL_TABLET | ORAL | 0 refills | Status: DC | PRN

## 2022-08-14 NOTE — Addendum Note (Signed)
Addended by: Charlton Amor on: 08/14/2022 08:07 AM   Modules accepted: Orders, Level of Service

## 2022-08-14 NOTE — Assessment & Plan Note (Signed)
-   based on headache symptoms and description I believe pt is having migraines  - sent in sumatriptan and told patient to keep migraine journal. If >15 migraines a month we will need to do pxp therapy or send to neuro for possible injectables

## 2022-08-18 ENCOUNTER — Ambulatory Visit (INDEPENDENT_AMBULATORY_CARE_PROVIDER_SITE_OTHER): Payer: 59 | Admitting: Family Medicine

## 2022-08-18 VITALS — BP 125/77 | HR 84 | Ht 62.0 in | Wt 172.0 lb

## 2022-08-18 DIAGNOSIS — R059 Cough, unspecified: Secondary | ICD-10-CM | POA: Diagnosis not present

## 2022-08-18 DIAGNOSIS — J4530 Mild persistent asthma, uncomplicated: Secondary | ICD-10-CM | POA: Insufficient documentation

## 2022-08-18 DIAGNOSIS — R062 Wheezing: Secondary | ICD-10-CM

## 2022-08-18 MED ORDER — ALBUTEROL SULFATE HFA 108 (90 BASE) MCG/ACT IN AERS: 2.0000 | INHALATION_SPRAY | Freq: Four times a day (QID) | RESPIRATORY_TRACT | 2 refills | Status: DC | PRN

## 2022-08-18 MED ORDER — ALBUTEROL SULFATE (2.5 MG/3ML) 0.083% IN NEBU
2.5000 mg | INHALATION_SOLUTION | Freq: Once | RESPIRATORY_TRACT | Status: AC
  Administered 2022-08-18: 2.5 mg via RESPIRATORY_TRACT

## 2022-08-18 NOTE — Assessment & Plan Note (Signed)
PFT associated with mild asthma.  On spirometry patient patient did improve postbronchodilator.  FEV1/FVC ratio is 77.  Sounds like a lot of her wheezing is associated with allergies.  We decided that we will treat with albuterol inhaler as needed.

## 2022-08-18 NOTE — Progress Notes (Signed)
Established patient visit   Patient: Lauren Walsh   DOB: 1970-05-11   52 y.o. Female  MRN: [id number] Visit Date: 08/18/2022  Today's healthcare provider: Charlton Amor, DO   Chief Complaint  Patient presents with   Cough    Spirometery- nurse visit.    SUBJECTIVE    Chief Complaint  Patient presents with   Cough    Spirometery- nurse visit.   HPI HPI     Cough    Additional comments: Spirometery- nurse visit.      Last edited by Elizabeth Palau, LPN on 6/81/2751 10:53 AM.      Patient presents for spirometry testing to assess wheezing.  Review of Systems  Constitutional:  Negative for activity change, fatigue and fever.  Respiratory:  Negative for cough and shortness of breath.   Cardiovascular:  Negative for chest pain.  Gastrointestinal:  Negative for abdominal pain.  Genitourinary:  Negative for difficulty urinating.       Current Meds  Medication Sig   albuterol (VENTOLIN HFA) 108 (90 Base) MCG/ACT inhaler Inhale 2 puffs into the lungs every 6 (six) hours as needed for wheezing or shortness of breath.   SUMAtriptan (IMITREX) 50 MG tablet Take 1 tablet (50 mg total) by mouth every 2 (two) hours as needed for migraine. May repeat in 2 hours if headache persists or recurs.   [DISCONTINUED] albuterol (PROVENTIL) (2.5 MG/3ML) 0.083% nebulizer solution Take 3 mLs (2.5 mg total) by nebulization every 6 (six) hours as needed for wheezing or shortness of breath.    OBJECTIVE    BP 125/77   Pulse 84   Ht 5\' 2"  (1.575 m)   Wt 172 lb (78 kg)   LMP  (LMP Unknown)   SpO2 98%   BMI 31.46 kg/m   Physical Exam Vitals and nursing note reviewed.  Constitutional:      General: She is not in acute distress.    Appearance: Normal appearance. She is well-developed.  HENT:     Head: Normocephalic and atraumatic.     Right Ear: External ear normal.     Left Ear: External ear normal.     Nose: Nose normal.  Eyes:     Conjunctiva/sclera: Conjunctivae  normal.  Cardiovascular:     Rate and Rhythm: Normal rate.  Pulmonary:     Effort: Pulmonary effort is normal.  Skin:    General: Skin is dry.     Coloration: Skin is not pale.  Neurological:     General: No focal deficit present.     Mental Status: She is alert and oriented to person, place, and time.  Psychiatric:        Mood and Affect: Mood normal.        Behavior: Behavior normal.        Thought Content: Thought content normal.        Judgment: Judgment normal.       ASSESSMENT & PLAN    Problem List Items Addressed This Visit       Respiratory   Mild persistent asthma without complication - Primary    PFT associated with mild asthma.  On spirometry patient patient did improve postbronchodilator.  FEV1/FVC ratio is 77.  Sounds like a lot of her wheezing is associated with allergies.  We decided that we will treat with albuterol inhaler as needed.      Relevant Medications   albuterol (VENTOLIN HFA) 108 (90 Base) MCG/ACT inhaler     Other  Wheezing   Other Visit Diagnoses     Cough, unspecified type       Relevant Medications   albuterol (PROVENTIL) (2.5 MG/3ML) 0.083% nebulizer solution 2.5 mg (Completed)   Other Relevant Orders   PR BRNCDILAT RSPSE SPMTRY PRE&POST-BRNCDILAT ADMN            Meds ordered this encounter  Medications   albuterol (PROVENTIL) (2.5 MG/3ML) 0.083% nebulizer solution 2.5 mg   albuterol (VENTOLIN HFA) 108 (90 Base) MCG/ACT inhaler    Sig: Inhale 2 puffs into the lungs every 6 (six) hours as needed for wheezing or shortness of breath.    Dispense:  8 g    Refill:  2    Orders Placed This Encounter  Procedures   PR BRNCDILAT RSPSE SPMTRY PRE&POST-BRNCDILAT ADMN     Charlton Amor, DO  [facility] at [facility] [phone] (phone) [phone] (fax)  [facility]

## 2022-09-12 ENCOUNTER — Ambulatory Visit: Payer: Self-pay | Admitting: Internal Medicine

## 2022-10-15 NOTE — Progress Notes (Deleted)
NEW PATIENT Date of Service/Encounter:  10/15/22 Referring provider: Charlton Amor, DO Primary care provider: Charlton Amor, DO  Subjective:  Lauren Walsh is a 52 y.o. female with a PMHx of migraines presenting today for evaluation of asthma, chronic rhinitis.  History obtained from: chart review and {Persons; PED relatives w/patient:19415::"patient"}.   History of wheezing/cough:  *** 2009 CXR read as mild bronchitis changes.   Chronic rhinitis: started *** Symptoms include: {Blank multiple:19196:a:"***","nasal congestion","rhinorrhea","post nasal drainage","sneezing","watery eyes","itchy eyes","itchy nose"}  Occurs {Blank single:19197::"year-round","seasonally-***","year-round with seasonal flares","***"} Potential triggers: *** Treatments tried: *** Previous allergy testing: {Blank single:19197::"yes","no"} History of reflux/heartburn: {Blank single:19197::"yes","no"} Previous sinus, ear, tonsil, adenoid surgeries: *** Previous sinus imaging: ***  Other allergy screening: Food allergy: {Blank single:19197::"yes","no"} Medication allergy: {Blank single:19197::"yes","no"} Hymenoptera allergy: {Blank single:19197::"yes","no"} Urticaria: {Blank single:19197::"yes","no"} Eczema:{Blank single:19197::"yes","no"} History of recurrent infections suggestive of immunodeficency: {Blank single:19197::"yes","no"} ***Vaccinations are up to date.   Past Medical History: Past Medical History:  Diagnosis Date   Recurrent UTI 12/30/2016   Seborrheic keratosis, inflamed 03/16/2017   Medication List:  Current Outpatient Medications  Medication Sig Dispense Refill   albuterol (VENTOLIN HFA) 108 (90 Base) MCG/ACT inhaler Inhale 2 puffs into the lungs every 6 (six) hours as needed for wheezing or shortness of breath. 8 g 2   SUMAtriptan (IMITREX) 50 MG tablet Take 1 tablet (50 mg total) by mouth every 2 (two) hours as needed for migraine. May repeat in 2 hours if headache persists or  recurs. 30 tablet 0   No current facility-administered medications for this visit.   Known Allergies:  No Known Allergies Past Surgical History: Past Surgical History:  Procedure Laterality Date   CESAREAN SECTION     TUBAL LIGATION     Family History: Family History  Problem Relation Age of Onset   Hypertension Mother    Prostate cancer Father    Diabetes Neg Hx    Heart attack Neg Hx    Stroke Neg Hx    Social History: Hildy lives ***.   ROS:  All other systems negative except as noted per HPI.  Objective:  There were no vitals taken for this visit. There is no height or weight on file to calculate BMI. Physical Exam:  General Appearance:  Alert, cooperative, no distress, appears stated age  Head:  Normocephalic, without obvious abnormality, atraumatic  Eyes:  Conjunctiva clear, EOM's intact  Nose: Nares normal, {Blank multiple:19196:a:"***","hypertrophic turbinates","normal mucosa","no visible anterior polyps","septum midline"}  Throat: Lips, tongue normal; teeth and gums normal, {Blank multiple:19196:a:"***","normal posterior oropharynx","tonsils 2+","tonsils 3+","no tonsillar exudate","+ cobblestoning","surgically absent tonsils","mildly erythematous posterior oropharynx"}  Neck: Supple, symmetrical  Lungs:   {Blank multiple:19196:a:"***","clear to auscultation bilaterally","end-expiratory wheezing","wheezing throughout"}, Respirations unlabored, {Blank multiple:19196:a:"***","no coughing","intermittent dry coughing","intermittent productive-sounding cough"}  Heart:  {Blank multiple:19196:a:"***","regular rate and rhythm","no murmur"}, Appears well perfused  Extremities: No edema  Skin: {Blank multiple:19196:a:"***","erythematous, dry patches scattered on ***","lichenification on ***","Skin color, texture, turgor normal","no rashes or lesions on visualized portions of skin"}  Neurologic: No gross deficits     Diagnostics: Spirometry:  Tracings reviewed. Her  effort: {Blank single:19197::"Good reproducible efforts.","It was hard to get consistent efforts and there is a question as to whether this reflects a maximal maneuver.","Poor effort, data can not be interpreted.","Variable effort-results affected","effort okay for first attempt at spirometry.","Results not reproducible due to ***"} FVC: ***L (pre), ***L  (post) FEV1: ***L, ***% predicted (pre), ***L, ***% predicted (post) FEV1/FVC ratio: *** (pre), *** (post) Interpretation: {Blank single:19197::"Spirometry consistent with mild obstructive disease","Spirometry consistent with moderate obstructive disease","Spirometry consistent  with severe obstructive disease","Spirometry consistent with possible restrictive disease","Spirometry consistent with mixed obstructive and restrictive disease","Spirometry uninterpretable due to technique","Spirometry consistent with normal pattern","No overt abnormalities noted given today's efforts","Nonobstructive ratio, low FEV1","Nonobstructive ratio, low FEV1, possible restriction"}.  Please see scanned spirometry results for details.  Skin Testing: {Blank single:19197::"Select foods","Environmental allergy panel","Environmental allergy panel and select foods","Food allergy panel","None","Deferred due to recent antihistamines use","deferred due to recent reaction","Pediatric Environmental Allergy Panel","Pediatric Food Panel","Select foods and environmental allergies"}. {Blank single:19197::"Adequate positive and negative controls","Inadequate positive control-testing invalid","Adequate positive and negative controls, dermatographism present, testing difficult to interpret"}. Results discussed with patient/family.   {Blank single:19197::"Allergy testing results were read and interpreted by myself, documented by clinical staff.","Allergy testing results were read by ***,FNP, documented by clinical staff"}  Assessment and Plan  ***  {Blank single:19197::"This note in  its entirety was forwarded to the Provider who requested this consultation."}  Thank you for your kind referral. I appreciate the opportunity to take part in [facility] care. Please do not hesitate to contact me with questions.***  Sincerely,  Tonny Bollman, MD [facility]

## 2022-10-17 ENCOUNTER — Ambulatory Visit: Payer: Self-pay | Admitting: Internal Medicine

## 2022-11-28 ENCOUNTER — Other Ambulatory Visit: Payer: Self-pay | Admitting: Family Medicine

## 2022-11-28 DIAGNOSIS — J4530 Mild persistent asthma, uncomplicated: Secondary | ICD-10-CM

## 2022-12-02 NOTE — Progress Notes (Unsigned)
NEW PATIENT Date of Service/Encounter:  12/03/22 Referring provider: Charlton Amor, DO Primary care provider: Charlton Amor, DO  Subjective:  Lauren Walsh is a 52 y.o. female with a PMHx of migraines presenting today for evaluation of wheezing, shortness of breath and chronic rhinitis. History obtained from: chart review and patient.   Wheezing/shortness of breath: -symptoms began in adult hood starting a few months ago.  -using rescue inhaler around 2 times per month Symptoms include dry cough and wheeze, worse at night -Limitations to daily activity: none - 0 ED visits, 0 UC visits and 0 oral steroids in the past year - 0 number of lifetime hospitalizations - Identified Triggers:  allergens - Up-to-date with vaccines. - History of prior pneumonias: no - Smoking exposure: none Previous Diagnostics:  - Prior PFTs or spirometry: none - Most Recent AEC : none -Most Recent Chest Imaging: CXR on (2009): mild bronchitic changes Management:  - Current regimen:  - Maintenance: none - Rescue: Albuterol 2 puffs q4-6 hrs PRN, none prior to exercise  Chronic rhinitis:  Symptoms include: nasal congestion, post nasal drainage, sneezing, watery eyes, and itchy eyes  Occurs year-round Potential triggers: dog, dust Treatments tried: claritin and flonase as needed Previous allergy testing: no History of reflux/heartburn: no Previous sinus, ear, tonsil, adenoid surgeries: noneo Previous sinus imaging: none  Atopic Dermatitis:  flares mostly on bends of arms and knees, nad occaisionally hands. Previous therapies tried HCT 2.5% (uses her husbands)   Reports use of fragrance/dye free products Sleep is not affected Antibiotics needed for skin infections: none   Chart Review:  Reviewed PCP notes from referral 08/13/2022:  Asthma       Pt states that she has tightness in her chest with wheezing around her daughter's dog. She believes she has Asthma.   Plan: referral to  allergy.  Other allergy screening: Food allergy: no Medication allergy: no Hymenoptera allergy: no History of recurrent infections suggestive of immunodeficency: no Vaccinations are up to date.   Past Medical History: Past Medical History:  Diagnosis Date   Recurrent UTI 12/30/2016   Seborrheic keratosis, inflamed 03/16/2017   Medication List:  Current Outpatient Medications  Medication Sig Dispense Refill   albuterol (VENTOLIN HFA) 108 (90 Base) MCG/ACT inhaler TAKE 2 PUFFS BY MOUTH EVERY 6 HOURS AS NEEDED FOR WHEEZE OR SHORTNESS OF BREATH 6.7 each 2   Albuterol-Budesonide (AIRSUPRA) 90-80 MCG/ACT AERO Inhale 2 puffs into the lungs as needed (maximum 12 puffs/day). 10.7 g 2   hydrocortisone 2.5 % ointment Apply topically twice daily as needed to red sandpapery rash. 30 g 3   ipratropium (ATROVENT) 0.03 % nasal spray Place 2 sprays into both nostrils 3 (three) times daily as needed for rhinitis. 30 mL 12   SUMAtriptan (IMITREX) 50 MG tablet Take 1 tablet (50 mg total) by mouth every 2 (two) hours as needed for migraine. May repeat in 2 hours if headache persists or recurs. 30 tablet 0   No current facility-administered medications for this visit.   Known Allergies:  No Known Allergies Past Surgical History: Past Surgical History:  Procedure Laterality Date   CESAREAN SECTION     TUBAL LIGATION     Family History: Family History  Problem Relation Age of Onset   Hypertension Mother    Prostate cancer Father    Diabetes Neg Hx    Heart attack Neg Hx    Stroke Neg Hx    Social History: Jazia lives in a house built 20 years  ago, no water damage, hardwood floors, electric heating, central AC, dogs, no roaches, not using DM protection on bedding or pillows, never smoker, + HEPA filter in the home.   ROS:  All other systems negative except as noted per HPI.  Objective:  Blood pressure 128/78, pulse 87, temperature 97.7 F (36.5 C), temperature source Temporal, resp. rate  20, height 5\' 2"  (1.575 m), weight 173 lb 14.4 oz (78.9 kg), SpO2 98%. Body mass index is 31.81 kg/m. Physical Exam:  General Appearance:  Alert, cooperative, no distress, appears stated age  Head:  Normocephalic, without obvious abnormality, atraumatic  Eyes:  Conjunctiva clear, EOM's intact  Ears EACs normal bilaterally and normal TMs bilaterally  Nose: Nares normal, hypertrophic turbinates, normal mucosa, and no visible anterior polyps  Throat: Lips, tongue normal; teeth and gums normal, normal posterior oropharynx and tonsils 2+  Neck: Supple, symmetrical  Lungs:   clear to auscultation bilaterally, Respirations unlabored, no coughing  Heart:  regular rate and rhythm and no murmur, Appears well perfused  Extremities: No edema  Skin: Skin color, texture, turgor normal and no rashes or lesions on visualized portions of skin  Neurologic: No gross deficits   Diagnostics: Spirometry:  Tracings reviewed. Her effort: Good reproducible efforts. FVC: 2.29L (pre), 2.44L  (post) FEV1: 1.82L, 83% predicted (pre), 1.97L, 90% predicted (post) FEV1/FVC ratio: 0.79 (pre), 0.81 (post) Interpretation: Spirometry consistent with normal pattern with partial response to bronchodilator Please see scanned spirometry results for details.  Skin Testing: Environmental allergy panel. Adequate positive and negative controls. Results discussed with patient/family.  Airborne Adult Perc - 12/03/22 1521     Time Antigen Placed 0320    Allergen Manufacturer Waynette Buttery    Location Back    Number of Test 55    Panel 1 Select    1. Control-Buffer 50% Glycerol Negative    2. Control-Histamine 3+    3. Bahia 3+    4. [place] 3+    5. Johnson 3+    6. Kentucky Blue 3+    7. Meadow Fescue 3+    8. Perennial Rye 3+    9. Timothy 3+    11. Cocklebur Negative    12. Plantain,  English 4+    13. Baccharis 3+    14. Dog Fennel 3+    15. Russian Thistle 3+    16. Lamb's Quarters 3+    17. Sheep Sorrell 3+     18. Rough Pigweed 3+    19. Marsh Elder, Rough 3+    20. Mugwort, Common Negative    21. Box, Elder 3+    22. Cedar, red 2+    23. Sweet Gum 2+    24. Pecan Pollen 2+    25. Pine Mix 2+    26. Walnut, Black Pollen 2+    27. Red Mulberry 3+    28. Ash Mix 3+    29. Birch Mix Negative    30. Beech American Negative    31. Cottonwood, Eastern 3+    32. Hickory, White 3+    33. Maple Mix Negative    34. Oak, [place] Mix 4+    35. Sycamore Eastern 3+    36. Alternaria Alternata Negative    37. Cladosporium Herbarum 2+    38. Aspergillus Mix 2+    39. Penicillium Mix Negative    40. Bipolaris Sorokiniana (Helminthosporium) Negative    41. Drechslera Spicifera (Curvularia) Negative    42. Mucor Plumbeus Negative    43. Fusarium  Moniliforme 2+    44. Aureobasidium Pullulans (pullulara) 2+    45. Rhizopus Oryzae Negative    46. Botrytis Cinera Negative    47. Epicoccum Nigrum Negative    48. Phoma Betae Negative    49. Dust Mite Mix 4+    50. Cat Hair 10,000 BAU/ml 3+    51.  Dog Epithelia 3+    52. Mixed Feathers Negative    53. Horse Epithelia Negative    54. Cockroach, German Negative    55. Tobacco Leaf Negative             Allergy testing results were read and interpreted by myself, documented by clinical staff.  Labs:  Lab Orders  No laboratory test(s) ordered today     Assessment and Plan  Intermittent Asthma: - your lung testing today looked great - During respiratory illness or asthma flares: Start Airsupra 2 puffs three times daily  and continue for 2 weeks or until symptoms resolve. - Rescue Inhaler: Airsupra 2 puffs. Use  every 4-6 hours as needed for chest tightness, wheezing, or coughing.   Can also use 15 minutes prior to exercise if you have symptoms with activity. - Asthma is not controlled if:  - Symptoms are occurring >2 times a week OR  - >2 times a month nighttime awakenings  - You are requiring systemic steroids (prednisone/steroid  injections) more than once per year  - Your require hospitalization for your asthma.  - Please call the clinic to schedule a follow up if these symptoms arise Avoid smoke exposure Stay up-to-date with your annual flu vaccines, COVID vaccines and pneumonia vaccines when indicated.  Chronic Rhinitis: determined to be Seasonal and Perennial Allergic: - allergy testing today: Positive to grass pollen, weed pollen, tree pollen, indoor and outdoor molds, dust mite, cat, dog - Prevention:  - allergen avoidance when possible - consider allergy shots as long term control of your symptoms by teaching your immune system to be more tolerant of your allergy triggers - Symptom control: - Continue Nasal Steroid Spray: Best results if used daily. - Options include Flonase (fluticasone), Nasocort (triamcinolone), Nasonex (mometasome) 1- 2 sprays in each nostril daily.  - All can be purchased over-the-counter if not covered by insurance. - Consider Atrovent (Ipratropium Bromide) 1-2 sprays in each nostril up to 3 times a day as needed for runny nose/post nasal drip/drainage.   - Use less frequently if airway gets too dry. - Continue Antihistamine: daily or daily as needed.   -Options include Zyrtec (Cetirizine) 10mg , Claritin (Loratadine) 10mg , Allegra (Fexofenadine) 180mg , or Xyzal (Levocetirinze) 5mg  - Can be purchased over-the-counter if not covered by insurance.  Allergic Conjunctivitis:  - Consider Allergy Eye drops-great options include Pataday (Olopatadine) or Zaditor (ketotifen) for eye symptoms daily as needed-both sold over the counter if not covered by insurance.  -Avoid eye drops that say red eye relief as they may contain medications that dry out your eyes.  Atopic Dermatitis:  Daily Care For Maintenance (daily and continue even once eczema controlled) - Use hypoallergenic hydrating ointment at least twice daily.  This must be done daily for control of flares. (Great options include Vaseline,  CeraVe, Aquaphor, Aveeno, Cetaphil, VaniCream, etc) - Avoid detergents, soaps or lotions with fragrances/dyes - Limit showers/baths to 5 minutes and use luke warm water instead of hot, pat dry following baths, and apply moisturizer - can use steroid/non-steroid therapy creams as detailed below up to twice weekly for prevention of flares.  For Flares:(add this to  maintenance therapy if needed for flares) First apply steroid/non-steroid treatment creams. Wait 5 minutes then apply moisturizer.  - Hydrocortisone 2.5% to face/body-apply topically twice daily to red, raised areas of skin, followed by moisturizer  Follow up : 3 months, sooner if needed It was a pleasure meeting you in clinic today! Thank you for allowing me to participate in your care.  This note in its entirety was forwarded to the Provider who requested this consultation.  Other: samples provided of: Airsupra, pataday  Thank you for your kind referral. I appreciate the opportunity to take part in [facility] care. Please do not hesitate to contact me with questions.  Sincerely,  Tonny Bollman, MD [facility]

## 2022-12-03 ENCOUNTER — Other Ambulatory Visit: Payer: Self-pay

## 2022-12-03 ENCOUNTER — Ambulatory Visit: Payer: 59 | Admitting: Internal Medicine

## 2022-12-03 ENCOUNTER — Encounter: Payer: Self-pay | Admitting: Internal Medicine

## 2022-12-03 VITALS — BP 128/78 | HR 87 | Temp 97.7°F | Resp 20 | Ht 62.0 in | Wt 173.9 lb

## 2022-12-03 DIAGNOSIS — J302 Other seasonal allergic rhinitis: Secondary | ICD-10-CM | POA: Diagnosis not present

## 2022-12-03 DIAGNOSIS — L2082 Flexural eczema: Secondary | ICD-10-CM

## 2022-12-03 DIAGNOSIS — J4520 Mild intermittent asthma, uncomplicated: Secondary | ICD-10-CM | POA: Diagnosis not present

## 2022-12-03 DIAGNOSIS — H1013 Acute atopic conjunctivitis, bilateral: Secondary | ICD-10-CM

## 2022-12-03 DIAGNOSIS — J3089 Other allergic rhinitis: Secondary | ICD-10-CM

## 2022-12-03 MED ORDER — LEVALBUTEROL TARTRATE 45 MCG/ACT IN AERO
4.0000 | INHALATION_SPRAY | Freq: Once | RESPIRATORY_TRACT | Status: AC
  Administered 2022-12-03: 4 via RESPIRATORY_TRACT

## 2022-12-03 MED ORDER — HYDROCORTISONE 2.5 % EX OINT: TOPICAL_OINTMENT | CUTANEOUS | 3 refills | Status: AC

## 2022-12-03 MED ORDER — IPRATROPIUM BROMIDE 0.03 % NA SOLN: 2.0000 | Freq: Three times a day (TID) | NASAL | 12 refills | Status: DC | PRN

## 2022-12-03 MED ORDER — AIRSUPRA 90-80 MCG/ACT IN AERO: 2.0000 | INHALATION_SPRAY | RESPIRATORY_TRACT | 2 refills | Status: DC | PRN

## 2022-12-03 NOTE — Patient Instructions (Signed)
Intermittent  Asthma: - your lung testing today looked great - During respiratory illness or asthma flares: Start Airsupra 2 puffs three times daily  and continue for 2 weeks or until symptoms resolve. - Rescue Inhaler: Airsupra 2 puffs. Use  every 4-6 hours as needed for chest tightness, wheezing, or coughing.   Can also use 15 minutes prior to exercise if you have symptoms with activity. - Asthma is not controlled if:  - Symptoms are occurring >2 times a week OR  - >2 times a month nighttime awakenings  - You are requiring systemic steroids (prednisone/steroid injections) more than once per year  - Your require hospitalization for your asthma.  - Please call the clinic to schedule a follow up if these symptoms arise Avoid smoke exposure Stay up-to-date with your annual flu vaccines, COVID vaccines and pneumonia vaccines when indicated.  Chronic Rhinitis: determined to be Seasonal and Perennial Allergic: - allergy testing today: Positive to grass pollen, weed pollen, tree pollen, indoor and outdoor molds, dust mite, cat, dog - Prevention:  - allergen avoidance when possible - consider allergy shots as long term control of your symptoms by teaching your immune system to be more tolerant of your allergy triggers - Symptom control: - Continue Nasal Steroid Spray: Best results if used daily. - Options include Flonase (fluticasone), Nasocort (triamcinolone), Nasonex (mometasome) 1- 2 sprays in each nostril daily.  - All can be purchased over-the-counter if not covered by insurance. - Consider Atrovent (Ipratropium Bromide) 1-2 sprays in each nostril up to 3 times a day as needed for runny nose/post nasal drip/drainage.   - Use less frequently if airway gets too dry. - Continue Antihistamine: daily or daily as needed.   -Options include Zyrtec (Cetirizine) 10mg , Claritin (Loratadine) 10mg , Allegra (Fexofenadine) 180mg , or Xyzal (Levocetirinze) 5mg  - Can be purchased over-the-counter if not  covered by insurance.  Allergic Conjunctivitis:  - Consider Allergy Eye drops-great options include Pataday (Olopatadine) or Zaditor (ketotifen) for eye symptoms daily as needed-both sold over the counter if not covered by insurance.  -Avoid eye drops that say red eye relief as they may contain medications that dry out your eyes.  Atopic Dermatitis:  Daily Care For Maintenance (daily and continue even once eczema controlled) - Use hypoallergenic hydrating ointment at least twice daily.  This must be done daily for control of flares. (Great options include Vaseline, CeraVe, Aquaphor, Aveeno, Cetaphil, VaniCream, etc) - Avoid detergents, soaps or lotions with fragrances/dyes - Limit showers/baths to 5 minutes and use luke warm water instead of hot, pat dry following baths, and apply moisturizer - can use steroid/non-steroid therapy creams as detailed below up to twice weekly for prevention of flares.  For Flares:(add this to maintenance therapy if needed for flares) First apply steroid/non-steroid treatment creams. Wait 5 minutes then apply moisturizer.  - Hydrocortisone 2.5% to face/body-apply topically twice daily to red, raised areas of skin, followed by moisturizer  Follow up : 3 months, sooner if needed It was a pleasure meeting you in clinic today! Thank you for allowing me to participate in your care.  Tonny Bollman, MD [facility]  DUST MITE AVOIDANCE MEASURES:  There are three main measures that need and can be taken to avoid house dust mites:  Reduce accumulation of dust in general -reduce furniture, clothing, carpeting, books, stuffed animals, especially in bedroom  Separate yourself from the dust -use pillow and mattress encasements (can be found at stores such as [employer] or  online) -avoid direct exposure to air condition flow -use a HEPA filter device, especially in the bedroom; you can also use a HEPA filter vacuum cleaner -wipe dust with  a moist towel instead of a dry towel or broom when cleaning  Decrease mites and/or their secretions -wash clothing and linen and stuffed animals at highest temperature possible, at least every 2 weeks -stuffed animals can also be placed in a bag and put in a freezer overnight  Despite the above measures, it is impossible to eliminate dust mites or their allergen completely from your home.  With the above measures the burden of mites in your home can be diminished, with the goal of minimizing your allergic symptoms.  Success will be reached only when implementing and using all means together. Control of Mold Allergen   Mold and fungi can grow on a variety of surfaces provided certain temperature and moisture conditions exist.  Outdoor molds grow on plants, decaying vegetation and soil.  The major outdoor mold, Alternaria and Cladosporium, are found in very high numbers during hot and dry conditions.  Generally, a late Summer - Fall peak is seen for common outdoor fungal spores.  Rain will temporarily lower outdoor mold spore count, but counts rise rapidly when the rainy period ends.  The most important indoor molds are Aspergillus and Penicillium.  Dark, humid and poorly ventilated basements are ideal sites for mold growth.  The next most common sites of mold growth are the bathroom and the kitchen.  Outdoor (Seasonal) Mold Control  Use air conditioning and keep windows closed Avoid exposure to decaying vegetation. Avoid leaf raking. Avoid grain handling. Consider wearing a face mask if working in moldy areas.    Indoor (Perennial) Mold Control   Maintain humidity below 50%. Clean washable surfaces with 5% bleach solution. Remove sources e.g. contaminated carpets.   Reducing Pollen Exposure  The American Academy of Allergy, Asthma and Immunology suggests the following steps to reduce your exposure to pollen during allergy seasons.    Do not hang sheets or clothing out to dry; pollen may  collect on these items. Do not mow lawns or spend time around freshly cut grass; mowing stirs up pollen. Keep windows closed at night.  Keep car windows closed while driving. Minimize morning activities outdoors, a time when pollen counts are usually at their highest. Stay indoors as much as possible when pollen counts or humidity is high and on windy days when pollen tends to remain in the air longer. Use air conditioning when possible.  Many air conditioners have filters that trap the pollen spores. Use a HEPA room air filter to remove pollen form the indoor air you breathe. Control of Dog or Cat Allergen  Avoidance is the best way to manage a dog or cat allergy. If you have a dog or cat and are allergic to dog or cats, consider removing the dog or cat from the home. If you have a dog or cat but don't want to find it a new home, or if your family wants a pet even though someone in the household is allergic, here are some strategies that may help keep symptoms at bay:  Keep the pet out of your bedroom and restrict it to only a few rooms. Be advised that keeping the dog or cat in only one room will not limit the allergens to that room. Don't pet, hug or kiss the dog or cat; if you do, wash your hands with soap and water. High-efficiency  particulate air (HEPA) cleaners run continuously in a bedroom or living room can reduce allergen levels over time. Regular use of a high-efficiency vacuum cleaner or a central vacuum can reduce allergen levels. Giving your dog or cat a bath at least once a week can reduce airborne allergen.

## 2023-02-26 ENCOUNTER — Encounter: Payer: Self-pay | Admitting: Family Medicine

## 2023-02-26 ENCOUNTER — Ambulatory Visit: Payer: 59 | Admitting: Family Medicine

## 2023-02-26 VITALS — BP 139/86 | HR 74 | Ht 62.0 in | Wt 175.8 lb

## 2023-02-26 DIAGNOSIS — J4530 Mild persistent asthma, uncomplicated: Secondary | ICD-10-CM | POA: Diagnosis not present

## 2023-02-26 DIAGNOSIS — F411 Generalized anxiety disorder: Secondary | ICD-10-CM | POA: Diagnosis not present

## 2023-02-26 MED ORDER — ALBUTEROL SULFATE HFA 108 (90 BASE) MCG/ACT IN AERS: 2.0000 | INHALATION_SPRAY | Freq: Four times a day (QID) | RESPIRATORY_TRACT | 2 refills | Status: DC | PRN

## 2023-02-26 MED ORDER — IPRATROPIUM BROMIDE 0.03 % NA SOLN: 2.0000 | Freq: Three times a day (TID) | NASAL | 12 refills | Status: DC | PRN

## 2023-02-26 MED ORDER — VENLAFAXINE HCL ER 37.5 MG PO CP24: 37.5000 mg | ORAL_CAPSULE | Freq: Every day | ORAL | 1 refills | Status: DC

## 2023-02-26 NOTE — Assessment & Plan Note (Signed)
Pt doing well, needs refills

## 2023-02-26 NOTE — Progress Notes (Signed)
Established patient visit   Patient: Lauren Walsh   DOB: 12-19-1970   52 y.o. Female  MRN: [id number] Visit Date: 02/26/2023  Today's healthcare provider: Charlton Amor, DO   Chief Complaint  Patient presents with   Anxiety    SUBJECTIVE    Chief Complaint  Patient presents with   Anxiety   HPI  Pt presents for concerns for anxiety. Has been getting more stressed with work and helping her daughter who had twins. Has also noted some hot flashes lately as well as she is in perimenopause.   Review of Systems  Constitutional:  Negative for activity change, fatigue and fever.  Respiratory:  Negative for cough and shortness of breath.   Cardiovascular:  Negative for chest pain.  Gastrointestinal:  Negative for abdominal pain.  Genitourinary:  Negative for difficulty urinating.  Psychiatric/Behavioral:  The patient is nervous/anxious.        Current Meds  Medication Sig   Albuterol-Budesonide (AIRSUPRA) 90-80 MCG/ACT AERO Inhale 2 puffs into the lungs as needed (maximum 12 puffs/day).   hydrocortisone 2.5 % ointment Apply topically twice daily as needed to red sandpapery rash.   SUMAtriptan (IMITREX) 50 MG tablet Take 1 tablet (50 mg total) by mouth every 2 (two) hours as needed for migraine. May repeat in 2 hours if headache persists or recurs.   venlafaxine XR (EFFEXOR XR) 37.5 MG 24 hr capsule Take 1 capsule (37.5 mg total) by mouth daily with breakfast.   [DISCONTINUED] albuterol (VENTOLIN HFA) 108 (90 Base) MCG/ACT inhaler TAKE 2 PUFFS BY MOUTH EVERY 6 HOURS AS NEEDED FOR WHEEZE OR SHORTNESS OF BREATH   [DISCONTINUED] ipratropium (ATROVENT) 0.03 % nasal spray Place 2 sprays into both nostrils 3 (three) times daily as needed for rhinitis.    OBJECTIVE    BP 139/86 (BP Location: Left Arm, Patient Position: Sitting, Cuff Size: Large)   Pulse 74   Ht 5\' 2"  (1.575 m)   Wt 175 lb 12 oz (79.7 kg)   SpO2 98%   BMI 32.15 kg/m   Physical Exam Vitals reviewed.   Constitutional:      Appearance: She is well-developed.  HENT:     Head: Normocephalic and atraumatic.  Eyes:     Conjunctiva/sclera: Conjunctivae normal.  Cardiovascular:     Rate and Rhythm: Normal rate.  Pulmonary:     Effort: Pulmonary effort is normal.  Skin:    General: Skin is dry.     Coloration: Skin is not pale.  Neurological:     Mental Status: She is alert and oriented to person, place, and time.  Psychiatric:        Behavior: Behavior normal.        ASSESSMENT & PLAN    Problem List Items Addressed This Visit       Respiratory   Mild persistent asthma without complication    Pt doing well, needs refills      Relevant Medications   albuterol (VENTOLIN HFA) 108 (90 Base) MCG/ACT inhaler   ipratropium (ATROVENT) 0.03 % nasal spray     Other   GAD (generalized anxiety disorder) - Primary    Discussed medication and therapy and pt is open to both  - will start effexor since she is also having some hot flashes - discussed exercising for a day during lunch and trying to control portions      Relevant Medications   venlafaxine XR (EFFEXOR XR) 37.5 MG 24 hr capsule   Other Relevant  Orders   Ambulatory referral to Behavioral Health    Return in about 6 weeks (around 04/09/2023) for anxiety and weight follow up.      Meds ordered this encounter  Medications   albuterol (VENTOLIN HFA) 108 (90 Base) MCG/ACT inhaler    Sig: Inhale 2 puffs into the lungs every 6 (six) hours as needed for wheezing or shortness of breath.    Dispense:  6.7 each    Refill:  2   ipratropium (ATROVENT) 0.03 % nasal spray    Sig: Place 2 sprays into both nostrils 3 (three) times daily as needed for rhinitis.    Dispense:  30 mL    Refill:  12   venlafaxine XR (EFFEXOR XR) 37.5 MG 24 hr capsule    Sig: Take 1 capsule (37.5 mg total) by mouth daily with breakfast.    Dispense:  30 capsule    Refill:  1    Orders Placed This Encounter  Procedures   Ambulatory referral  to Behavioral Health    Referral Priority:   Routine    Referral Type:   Psychiatric    Referral Reason:   Specialty Services Required    Requested Specialty:   Behavioral Health    Number of Visits Requested:   1     Charlton Amor, DO  [facility] at [facility] [phone] (phone) [phone] (fax)  [facility]

## 2023-02-26 NOTE — Assessment & Plan Note (Signed)
Discussed medication and therapy and pt is open to both  - will start effexor since she is also having some hot flashes - discussed exercising for a day during lunch and trying to control portions

## 2023-03-05 ENCOUNTER — Ambulatory Visit: Payer: 59 | Admitting: Internal Medicine

## 2023-03-25 ENCOUNTER — Encounter: Payer: Self-pay | Admitting: Internal Medicine

## 2023-03-25 ENCOUNTER — Ambulatory Visit: Payer: 59 | Admitting: Internal Medicine

## 2023-03-25 VITALS — BP 110/82 | HR 72 | Temp 97.3°F | Resp 16

## 2023-03-25 DIAGNOSIS — J3089 Other allergic rhinitis: Secondary | ICD-10-CM

## 2023-03-25 DIAGNOSIS — J4530 Mild persistent asthma, uncomplicated: Secondary | ICD-10-CM

## 2023-03-25 DIAGNOSIS — J302 Other seasonal allergic rhinitis: Secondary | ICD-10-CM

## 2023-03-25 DIAGNOSIS — L2082 Flexural eczema: Secondary | ICD-10-CM | POA: Diagnosis not present

## 2023-03-25 DIAGNOSIS — H1013 Acute atopic conjunctivitis, bilateral: Secondary | ICD-10-CM

## 2023-03-25 DIAGNOSIS — J4520 Mild intermittent asthma, uncomplicated: Secondary | ICD-10-CM

## 2023-03-25 MED ORDER — AIRSUPRA 90-80 MCG/ACT IN AERO: 2.0000 | INHALATION_SPRAY | RESPIRATORY_TRACT | 2 refills | Status: AC | PRN

## 2023-03-25 MED ORDER — IPRATROPIUM BROMIDE 0.03 % NA SOLN: 2.0000 | Freq: Three times a day (TID) | NASAL | 12 refills | Status: AC | PRN

## 2023-03-25 MED ORDER — TRIAMCINOLONE ACETONIDE 0.1 % EX OINT: TOPICAL_OINTMENT | CUTANEOUS | 1 refills | Status: AC

## 2023-03-25 NOTE — Patient Instructions (Addendum)
Intermittent  Asthma: - your lung testing today looked great - During respiratory illness or asthma flares: Start Airsupra 2 puffs three times daily  and continue for 2 weeks or until symptoms resolve. - Rescue Inhaler: Airsupra 2 puffs. Use  every 4-6 hours as needed for chest tightness, wheezing, or coughing.   Can also use 15 minutes prior to exercise if you have symptoms with activity. - Asthma is not controlled if:  - Symptoms are occurring >2 times a week OR  - >2 times a month nighttime awakenings  - You are requiring systemic steroids (prednisone/steroid injections) more than once per year  - Your require hospitalization for your asthma.  - Please call the clinic to schedule a follow up if these symptoms arise Avoid smoke exposure Stay up-to-date with your annual flu vaccines, COVID vaccines and pneumonia vaccines when indicated.  Seasonal and Perennial Allergic Rhinitis - allergy testing 2024 Positive to grass pollen, weed pollen, tree pollen, indoor and outdoor molds, dust mite, cat, dog - Prevention:  - allergen avoidance when possible - consider allergy shots as long term control of your symptoms by teaching your immune system to be more tolerant of your allergy triggers - Symptom control: - Continue Nasal Steroid Spray: Best results if used daily. - Options include Flonase (fluticasone), Nasocort (triamcinolone), Nasonex (mometasome) 1- 2 sprays in each nostril daily.  - All can be purchased over-the-counter if not covered by insurance. - Consider Atrovent (Ipratropium Bromide) 1-2 sprays in each nostril up to 3 times a day as needed for runny nose/post nasal drip/drainage.   - Use less frequently if airway gets too dry. - Continue Antihistamine: daily or daily as needed.   -Options include Zyrtec (Cetirizine) 10mg , Claritin (Loratadine) 10mg , Allegra (Fexofenadine) 180mg , or Xyzal (Levocetirinze) 5mg  - Can be purchased over-the-counter if not covered by insurance.  Allergic  Conjunctivitis:  - Consider Allergy Eye drops-great options include Pataday (Olopatadine) or Zaditor (ketotifen) for eye symptoms daily as needed-both sold over the counter if not covered by insurance.  -Avoid eye drops that say red eye relief as they may contain medications that dry out your eyes.  Atopic Dermatitis:  Daily Care For Maintenance (daily and continue even once eczema controlled) - Use hypoallergenic hydrating ointment at least twice daily.  This must be done daily for control of flares. (Great options include Vaseline, CeraVe, Aquaphor, Aveeno, Cetaphil, VaniCream, etc) - Avoid detergents, soaps or lotions with fragrances/dyes - Limit showers/baths to 5 minutes and use luke warm water instead of hot, pat dry following baths, and apply moisturizer - can use steroid/non-steroid therapy creams as detailed below up to twice weekly for prevention of flares.  For Flares:(add this to maintenance therapy if needed for flares) First apply steroid/non-steroid treatment creams. Wait 5 minutes then apply moisturizer.  - Hydrocortisone 2.5% to face/body-apply topically twice daily to red, raised areas of skin, followed by moisturizer - triamcionolone 0.1% twice daily as needed for hands for flares.  Follow up : 6 months, sooner if needed It was a pleasure seeing you again in clinic today! Thank you for allowing me to participate in your care.  Tonny Bollman, MD [facility]

## 2023-03-25 NOTE — Progress Notes (Signed)
FOLLOW UP Date of Service/Encounter:  03/25/23  Subjective:  Lauren Walsh (DOB: 10-27-1970) is a 52 y.o. female who [facility] in re-evaluation of the following: intermittent asthma, allergic rhinoconjunctivitis, atopic dermatitis. History obtained from: chart review and patient.  For Review, LV was on 12/03/22  with Dr.Nandi Tonnesen seen for a visit for intermittent asthma, allergic rhinoconjunctivitis, atopic dermatitis. See below for summary of history and diagnostics.   ----------------------------------------------------- Pertinent History/Diagnostics:  Asthma: Started having issues as an adult including dry cough and wheeze, worse at night.  Using rescue inhaler around twice a month.  No ED or oral steroids in the past year, no lifetime hospitalizations due to asthma.  Up-to-date with vaccines.  No smoke exposure.  2009 chest x-ray showed mild bronchitic changes. - normal spirometry (12/03/22): ratio 0.79, 83% FEV1 (pre), 90% FEV1 (post), partial response to bronchodilator Plan: Airsupra as needed Allergic Rhinitis:  Nasal congestion, postnasal drainage, sneezing, watery eyes, itchy eyes, year-round.  No prior ENT surgeries.  No history of reflux. - SPT environmental panel (12/03/22): Positive to grass pollen, weed pollen, tree pollen, indoor and outdoor molds, dust mite, cat, dog  -Dogs in the home Plan IN CS, Atrovent nasal spray as needed, over-the-counter antihistamine as needed, over-the-counter allergy eyedrops as needed Eczema: Bends of arms and knees and occasionally hands.  Using dye and fragrance free products. Plan: Hydrocortisone 2-1/2% twice daily as needed  --------------------------------------------------- Today presents for follow-up. She is doing excellent since last visit.  She has only required her Airsupra inhaler around once every few weeks.  She has required no urgent care visits, no ED visits, no oral steroids.  She  has also not required any antibiotics.  She does have more frequent nasal symptoms, but is able to use her Atrovent nasal spray as needed and this clears up her symptoms immediately.  Her skin has been doing overall okay, but she does have a flare on her hand.  She is using hydrocortisone 2-1/2% and is almost cleared.  She does not have anything stronger than hydrocortisone 2-1/2%  All medications reviewed by clinical staff and updated in chart. No new pertinent medical or surgical history except as noted in HPI.  ROS: All others negative except as noted per HPI.   Objective:  BP 110/82 (BP Location: Right Arm, Patient Position: Sitting, Cuff Size: Small)   Pulse 72   Temp (!) 97.3 F (36.3 C) (Temporal)   Resp 16   SpO2 98%  There is no height or weight on file to calculate BMI. Physical Exam: General Appearance:  Alert, cooperative, no distress, appears stated age  Head:  Normocephalic, without obvious abnormality, atraumatic  Eyes:  Conjunctiva clear, EOM's intact  Ears EACs normal bilaterally and normal TMs bilaterally  Nose: Nares normal, hypertrophic turbinates, normal mucosa, and no visible anterior polyps  Throat: Lips, tongue normal; teeth and gums normal, normal posterior oropharynx  Neck: Supple, symmetrical  Lungs:   clear to auscultation bilaterally, Respirations unlabored, no coughing  Heart:  regular rate and rhythm and no murmur, Appears well perfused  Extremities: No edema  Skin: Small hyperpigmented dry patch on right hand between 4th and 5th digits, dorsal surface  Neurologic: No gross deficits   Labs:  Lab Orders  No laboratory test(s) ordered today    Spirometry:  Tracings reviewed. Her effort: Good reproducible efforts. FVC: 2.58L FEV1: 2.0L, 92% predicted FEV1/FVC ratio: 0.78 Interpretation: Spirometry consistent with normal pattern.  Please see scanned spirometry  results for details.  Assessment/Plan   Intermittent Asthma: at goal - your lung  testing today looked great - During respiratory illness or asthma flares: Start Airsupra 2 puffs three times daily  and continue for 2 weeks or until symptoms resolve. - Rescue Inhaler: Airsupra 2 puffs. Use  every 4-6 hours as needed for chest tightness, wheezing, or coughing.   Can also use 15 minutes prior to exercise if you have symptoms with activity. - Asthma is not controlled if:  - Symptoms are occurring >2 times a week OR  - >2 times a month nighttime awakenings  - You are requiring systemic steroids (prednisone/steroid injections) more than once per year  - Your require hospitalization for your asthma.  - Please call the clinic to schedule a follow up if these symptoms arise Avoid smoke exposure Stay up-to-date with your annual flu vaccines, COVID vaccines and pneumonia vaccines when indicated.  Seasonal and Perennial Allergic Rhinitis-at goal - allergy testing 2024 Positive to grass pollen, weed pollen, tree pollen, indoor and outdoor molds, dust mite, cat, dog - Prevention:  - allergen avoidance when possible - consider allergy shots as long term control of your symptoms by teaching your immune system to be more tolerant of your allergy triggers - Symptom control: - Continue Nasal Steroid Spray: Best results if used daily. - Options include Flonase (fluticasone), Nasocort (triamcinolone), Nasonex (mometasome) 1- 2 sprays in each nostril daily.  - All can be purchased over-the-counter if not covered by insurance. - Consider Atrovent (Ipratropium Bromide) 1-2 sprays in each nostril up to 3 times a day as needed for runny nose/post nasal drip/drainage.   - Use less frequently if airway gets too dry. - Continue Antihistamine: daily or daily as needed.   -Options include Zyrtec (Cetirizine) 10mg , Claritin (Loratadine) 10mg , Allegra (Fexofenadine) 180mg , or Xyzal (Levocetirinze) 5mg  - Can be purchased over-the-counter if not covered by insurance.  Allergic Conjunctivitis: at goal -  Consider Allergy Eye drops-great options include Pataday (Olopatadine) or Zaditor (ketotifen) for eye symptoms daily as needed-both sold over the counter if not covered by insurance.  -Avoid eye drops that say red eye relief as they may contain medications that dry out your eyes.  Atopic Dermatitis: at goal Daily Care For Maintenance (daily and continue even once eczema controlled) - Use hypoallergenic hydrating ointment at least twice daily.  This must be done daily for control of flares. (Great options include Vaseline, CeraVe, Aquaphor, Aveeno, Cetaphil, VaniCream, etc) - Avoid detergents, soaps or lotions with fragrances/dyes - Limit showers/baths to 5 minutes and use luke warm water instead of hot, pat dry following baths, and apply moisturizer - can use steroid/non-steroid therapy creams as detailed below up to twice weekly for prevention of flares.  For Flares:(add this to maintenance therapy if needed for flares) First apply steroid/non-steroid treatment creams. Wait 5 minutes then apply moisturizer.  - Hydrocortisone 2.5% to face/body-apply topically twice daily to red, raised areas of skin, followed by moisturizer - triamcionolone 0.1% twice daily as needed for hands for flares.  Follow up : 6 months, sooner if needed It was a pleasure seeing you again in clinic today! Thank you for allowing me to participate in your care.  Other: none  Tonny Bollman, MD  [facility]

## 2023-04-09 ENCOUNTER — Encounter: Payer: Self-pay | Admitting: Family Medicine

## 2023-04-09 ENCOUNTER — Telehealth: Payer: 59 | Admitting: Family Medicine

## 2023-04-09 VITALS — Ht 62.0 in | Wt 175.8 lb

## 2023-04-09 DIAGNOSIS — F411 Generalized anxiety disorder: Secondary | ICD-10-CM

## 2023-04-09 DIAGNOSIS — E66811 Obesity, class 1: Secondary | ICD-10-CM

## 2023-04-09 DIAGNOSIS — Z6832 Body mass index (BMI) 32.0-32.9, adult: Secondary | ICD-10-CM | POA: Diagnosis not present

## 2023-04-09 DIAGNOSIS — R5383 Other fatigue: Secondary | ICD-10-CM

## 2023-04-09 DIAGNOSIS — Z131 Encounter for screening for diabetes mellitus: Secondary | ICD-10-CM

## 2023-04-09 DIAGNOSIS — E6609 Other obesity due to excess calories: Secondary | ICD-10-CM | POA: Insufficient documentation

## 2023-04-09 MED ORDER — ESCITALOPRAM OXALATE 10 MG PO TABS: 10.0000 mg | ORAL_TABLET | Freq: Every day | ORAL | 2 refills | Status: DC

## 2023-04-09 NOTE — Assessment & Plan Note (Signed)
Will get blood to assess for diabetes as well as fatty liver disease  - we did discuss healthy weight and wellness however I would like to get blood work first to rule out diabetes and then look into a referral

## 2023-04-09 NOTE — Progress Notes (Signed)
Established patient visit   Patient: Lauren Walsh   DOB: 10/30/1970   52 y.o. Female  MRN: [id number] Visit Date: 04/09/2023  Today's healthcare provider: Charlton Amor, DO   Chief Complaint  Patient presents with   Medical Management of Chronic Issues    Anxiety and weight loss    SUBJECTIVE    Chief Complaint  Patient presents with   Medical Management of Chronic Issues    Anxiety and weight loss   HPI HPI     Medical Management of Chronic Issues    Additional comments: Anxiety and weight loss      Last edited by Roselyn Reef, CMA on 04/09/2023  2:17 PM.      I connected with  Lauren Walsh on 04/09/23 by a video and audio enabled telemedicine application and verified that I am speaking with the correct person using two identifiers.  Patient Location: Home  Provider Location: Office/Clinic  I discussed the limitations of evaluation and management by telemedicine. The patient expressed understanding and agreed to proceed.  Pt here to follow up on GAD and weight. She tried the effexor for about a week and it made her sick so she stopped. She is however open to trying a different medicine.  Review of Systems  Constitutional:  Negative for activity change, fatigue and fever.  Respiratory:  Negative for cough and shortness of breath.   Cardiovascular:  Negative for chest pain.  Gastrointestinal:  Negative for abdominal pain.  Genitourinary:  Negative for difficulty urinating.       Current Meds  Medication Sig   albuterol (VENTOLIN HFA) 108 (90 Base) MCG/ACT inhaler Inhale 2 puffs into the lungs every 6 (six) hours as needed for wheezing or shortness of breath.   Albuterol-Budesonide (AIRSUPRA) 90-80 MCG/ACT AERO Inhale 2 puffs into the lungs as needed (maximum 12 puffs/day).   escitalopram (LEXAPRO) 10 MG tablet Take 1 tablet (10 mg total) by mouth daily.   hydrocortisone 2.5 % ointment Apply topically twice daily as needed to red sandpapery rash.    ipratropium (ATROVENT) 0.03 % nasal spray Place 2 sprays into both nostrils 3 (three) times daily as needed for rhinitis.   SUMAtriptan (IMITREX) 50 MG tablet Take 1 tablet (50 mg total) by mouth every 2 (two) hours as needed for migraine. May repeat in 2 hours if headache persists or recurs.   triamcinolone ointment (KENALOG) 0.1 % Apply topically twice daily to BODY as needed for red, sandpaper like rash.  Do not use on face, groin or armpits.    OBJECTIVE      Physical Exam Vitals reviewed.  Constitutional:      Appearance: She is well-developed.  HENT:     Head: Normocephalic and atraumatic.  Eyes:     Conjunctiva/sclera: Conjunctivae normal.  Cardiovascular:     Rate and Rhythm: Normal rate.  Pulmonary:     Effort: Pulmonary effort is normal.  Skin:    General: Skin is dry.     Coloration: Skin is not pale.  Neurological:     Mental Status: She is alert and oriented to person, place, and time.  Psychiatric:        Behavior: Behavior normal.        ASSESSMENT & PLAN    Problem List Items Addressed This Visit       Other   GAD (generalized anxiety disorder) - Primary    Pt has stopped effexor will go ahead and have her start lexapro to  see if we can get her to have better benefit.  - did discuss side effects.      Relevant Medications   escitalopram (LEXAPRO) 10 MG tablet   Other Relevant Orders   TSH + free T4   Class 1 obesity due to excess calories without serious comorbidity with body mass index (BMI) of 32.0 to 32.9 in adult    Will get blood to assess for diabetes as well as fatty liver disease  - we did discuss healthy weight and wellness however I would like to get blood work first to rule out diabetes and then look into a referral      Relevant Orders   CMP14+EGFR   HgB A1c   Other fatigue   Relevant Orders   CBC   CMP14+EGFR   Other Visit Diagnoses     Screening for diabetes mellitus       Relevant Orders   HgB A1c       Return in  about 4 weeks (around 05/07/2023) for GAD follow up and blood work.      Meds ordered this encounter  Medications   escitalopram (LEXAPRO) 10 MG tablet    Sig: Take 1 tablet (10 mg total) by mouth daily.    Dispense:  30 tablet    Refill:  2    Orders Placed This Encounter  Procedures   TSH + free T4   CBC   CMP14+EGFR   HgB A1c     Charlton Amor, DO  [facility] Primary Care & Sports Medicine at [facility] [phone] (phone) [phone] (fax)  [facility]

## 2023-04-09 NOTE — Assessment & Plan Note (Signed)
Pt has stopped effexor will go ahead and have her start lexapro to see if we can get her to have better benefit.  - did discuss side effects.

## 2023-07-13 ENCOUNTER — Encounter: Payer: Self-pay | Admitting: Family Medicine

## 2023-07-17 ENCOUNTER — Ambulatory Visit: Admitting: Sports Medicine

## 2023-07-17 DIAGNOSIS — F411 Generalized anxiety disorder: Secondary | ICD-10-CM | POA: Diagnosis not present

## 2023-07-17 NOTE — Progress Notes (Signed)
    Procedures performed today:    None.  Independent interpretation of notes and tests performed by another provider:   None.  Brief History, Exam, Impression, and Recommendations:    GAD (generalized anxiety disorder) [name], she has known generalized anxiety, well-controlled with Lexapro, she does get some agoraphobia and difficulty with focusing outside of the house, she does have reasonable accommodations to work at home most of the days of the week, she did need an additional form filled out some happy to do this today. I did inform the patient that her PCP was leaving and she needs to find another one ASAP    ____________________________________________ Ihor Austin. Benjamin Stain, M.D., ABFM., CAQSM., AME. Primary Care and Sports Medicine [facility]  Adjunct Professor of Family Medicine  [employer] [employer] [facility] [employer]  Restaurant manager, fast food

## 2023-07-17 NOTE — Assessment & Plan Note (Signed)
 [name], she has known generalized anxiety, well-controlled with Lexapro, she does get some agoraphobia and difficulty with focusing outside of the house, she does have reasonable accommodations to work at home most of the days of the week, she did need an additional form filled out some happy to do this today. I did inform the patient that her PCP was leaving and she needs to find another one ASAP

## 2023-07-31 ENCOUNTER — Ambulatory Visit: Admitting: Medical-Surgical

## 2023-07-31 ENCOUNTER — Encounter: Payer: Self-pay | Admitting: Medical-Surgical

## 2023-07-31 VITALS — BP 136/82 | HR 69 | Resp 20 | Ht 62.0 in | Wt 183.2 lb

## 2023-07-31 DIAGNOSIS — E559 Vitamin D deficiency, unspecified: Secondary | ICD-10-CM | POA: Diagnosis not present

## 2023-07-31 DIAGNOSIS — Z13220 Encounter for screening for lipoid disorders: Secondary | ICD-10-CM

## 2023-07-31 DIAGNOSIS — G43009 Migraine without aura, not intractable, without status migrainosus: Secondary | ICD-10-CM | POA: Diagnosis not present

## 2023-07-31 DIAGNOSIS — R5383 Other fatigue: Secondary | ICD-10-CM

## 2023-07-31 DIAGNOSIS — F411 Generalized anxiety disorder: Secondary | ICD-10-CM | POA: Diagnosis not present

## 2023-07-31 DIAGNOSIS — Z7689 Persons encountering health services in other specified circumstances: Secondary | ICD-10-CM

## 2023-07-31 MED ORDER — ESCITALOPRAM OXALATE 10 MG PO TABS: 10.0000 mg | ORAL_TABLET | Freq: Every day | ORAL | 3 refills | Status: AC

## 2023-07-31 NOTE — Progress Notes (Signed)
        Established patient visit  History, exam, impression, and plan:  1. Encounter to establish care (Primary) Very pleasant 53 year old female presenting today for transfer care to a new provider.  Reviewed available information and discussed care concerns with patient.   2. GAD (generalized anxiety disorder) Long-term history of anxiety is currently well-managed with Lexapro 10 mg daily.  Tolerates the medication without side effects.  Feels that this medication keeps her mood stable.  Denies SI/HI.  Continue Lexapro as prescribed.  3. Migraine without aura and without status migrainosus, not intractable History of migraines and is using Imitrex for abortive measures.  Doing well on this and does not need any refills at this time.  4. Vitamin D deficiency History of vitamin D.  Not currently taking any supplementation.  Rechecking today. - VITAMIN D 25 Hydroxy (Vit-D Deficiency, Fractures)  5. Fatigue, unspecified type Reports that she does experience a lot of fatigue and is unsure what may be causing this.  Has not been able to identify any particular triggers.  Plan to check labs as below to rule out metabolic cause. - CBC with Differential/Platelet - CMP14+EGFR - TSH - Iron, TIBC and Ferritin Panel  6. Lipid screening Checking lipids. - Lipid panel   Procedures performed this visit: None.  Return in about 6 months (around 01/31/2024) for chronic disease follow up.  __________________________________ Thayer Ohm, DNP, APRN, FNP-BC Primary Care and Sports Medicine [facility] [place]

## 2023-08-01 ENCOUNTER — Encounter: Payer: Self-pay | Admitting: Medical-Surgical

## 2023-08-01 LAB — CBC WITH DIFFERENTIAL/PLATELET
Basophils Absolute: 0.1 10*3/uL (ref 0.0–0.2)
Basos: 1 %
EOS (ABSOLUTE): 0.4 10*3/uL (ref 0.0–0.4)
Eos: 6 %
Hematocrit: 38.4 % (ref 34.0–46.6)
Hemoglobin: 12 g/dL (ref 11.1–15.9)
Immature Grans (Abs): 0 10*3/uL (ref 0.0–0.1)
Immature Granulocytes: 0 %
Lymphocytes Absolute: 1.5 10*3/uL (ref 0.7–3.1)
Lymphs: 27 %
MCH: 27 pg (ref 26.6–33.0)
MCHC: 31.3 g/dL — ABNORMAL LOW (ref 31.5–35.7)
MCV: 86 fL (ref 79–97)
Monocytes Absolute: 0.3 10*3/uL (ref 0.1–0.9)
Monocytes: 5 %
Neutrophils Absolute: 3.5 10*3/uL (ref 1.4–7.0)
Neutrophils: 61 %
Platelets: 273 10*3/uL (ref 150–450)
RBC: 4.45 x10E6/uL (ref 3.77–5.28)
RDW: 12.5 % (ref 11.7–15.4)
WBC: 5.8 10*3/uL (ref 3.4–10.8)

## 2023-08-01 LAB — LIPID PANEL
Chol/HDL Ratio: 3.9 ratio (ref 0.0–4.4)
Cholesterol, Total: 208 mg/dL — ABNORMAL HIGH (ref 100–199)
HDL: 54 mg/dL (ref >39)
LDL Chol Calc (NIH): 143 mg/dL — ABNORMAL HIGH (ref 0–99)
Triglycerides: 61 mg/dL (ref 0–149)
VLDL Cholesterol Cal: 11 mg/dL (ref 5–40)

## 2023-08-01 LAB — CMP14+EGFR
ALT: 16 IU/L (ref 0–32)
AST: 19 IU/L (ref 0–40)
Albumin: 4.2 g/dL (ref 3.8–4.9)
Alkaline Phosphatase: 94 IU/L (ref 44–121)
BUN/Creatinine Ratio: 16 (ref 9–23)
BUN: 15 mg/dL (ref 6–24)
Bilirubin Total: 0.4 mg/dL (ref 0.0–1.2)
CO2: 22 mmol/L (ref 20–29)
Calcium: 9.3 mg/dL (ref 8.7–10.2)
Chloride: 106 mmol/L (ref 96–106)
Creatinine, Ser: 0.94 mg/dL (ref 0.57–1.00)
Globulin, Total: 2.9 g/dL (ref 1.5–4.5)
Glucose: 84 mg/dL (ref 70–99)
Potassium: 4.2 mmol/L (ref 3.5–5.2)
Sodium: 140 mmol/L (ref 134–144)
Total Protein: 7.1 g/dL (ref 6.0–8.5)
eGFR: 73 mL/min/{1.73_m2} (ref >59)

## 2023-08-01 LAB — IRON,TIBC AND FERRITIN PANEL
Ferritin: 149 ng/mL (ref 15–150)
Iron Saturation: 30 % (ref 15–55)
Iron: 87 ug/dL (ref 27–159)
Total Iron Binding Capacity: 290 ug/dL (ref 250–450)
UIBC: 203 ug/dL (ref 131–425)

## 2023-08-01 LAB — VITAMIN D 25 HYDROXY (VIT D DEFICIENCY, FRACTURES): Vit D, 25-Hydroxy: 31.2 ng/mL (ref 30.0–100.0)

## 2023-08-01 LAB — TSH: TSH: 0.433 u[IU]/mL — ABNORMAL LOW (ref 0.450–4.500)

## 2023-08-03 NOTE — Telephone Encounter (Signed)
 Added requested testing and diagnosis code to existing blood in [facility]

## 2023-08-04 ENCOUNTER — Encounter: Payer: Self-pay | Admitting: Medical-Surgical

## 2023-08-04 LAB — SPECIMEN STATUS REPORT

## 2023-08-04 LAB — T4, FREE: Free T4: 1.19 ng/dL (ref 0.82–1.77)

## 2023-08-06 ENCOUNTER — Encounter: Payer: Self-pay | Admitting: Medical-Surgical

## 2023-09-02 ENCOUNTER — Other Ambulatory Visit: Payer: Self-pay | Admitting: Family Medicine

## 2023-09-02 DIAGNOSIS — J4530 Mild persistent asthma, uncomplicated: Secondary | ICD-10-CM

## 2024-01-05 ENCOUNTER — Encounter: Payer: Self-pay | Admitting: Sports Medicine

## 2024-02-02 ENCOUNTER — Ambulatory Visit: Admitting: Medical-Surgical

## 2024-02-02 VITALS — BP 121/81 | HR 94 | Ht 62.0 in | Wt 172.0 lb

## 2024-02-02 DIAGNOSIS — F411 Generalized anxiety disorder: Secondary | ICD-10-CM

## 2024-02-02 DIAGNOSIS — G43009 Migraine without aura, not intractable, without status migrainosus: Secondary | ICD-10-CM

## 2024-02-02 DIAGNOSIS — J4530 Mild persistent asthma, uncomplicated: Secondary | ICD-10-CM

## 2024-02-02 DIAGNOSIS — R946 Abnormal results of thyroid function studies: Secondary | ICD-10-CM | POA: Diagnosis not present

## 2024-02-02 DIAGNOSIS — Z1231 Encounter for screening mammogram for malignant neoplasm of breast: Secondary | ICD-10-CM

## 2024-02-02 MED ORDER — SUMATRIPTAN SUCCINATE 50 MG PO TABS: 50.0000 mg | ORAL_TABLET | ORAL | 0 refills | Status: AC | PRN

## 2024-02-02 NOTE — Assessment & Plan Note (Signed)
 Well-controlled with no recent exacerbations. - Ensure albuterol  prescription is up to date.

## 2024-02-02 NOTE — Assessment & Plan Note (Signed)
 Prior TSH low with normal free T4 not indicative of treatment. No current concerning symptoms.  - Rechecking TSH and free T4.

## 2024-02-02 NOTE — Progress Notes (Signed)
 Established patient visit   History of Present Illness   Discussed the use of AI scribe software for clinical note transcription with the patient, who gave verbal consent to proceed.  History of Present Illness   Lauren Walsh is a 53 year old female who presents for a follow-up regarding her mental health and medication management.  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  Constitutional:      General: She is not in acute distress.    Appearance: Normal appearance. She is not ill-appearing.  HENT:     Head: Normocephalic and atraumatic.  Cardiovascular:     Rate and Rhythm: Normal rate and regular rhythm.     Pulses: Normal pulses.     Heart sounds: Normal heart sounds. No murmur heard.    No friction rub. No gallop.  Pulmonary:     Effort: Pulmonary effort is normal. No respiratory distress.     Breath sounds: Normal breath sounds. No wheezing.  Skin:    General: Skin is warm and dry.  Neurological:     Mental Status: She is alert and oriented to person, place, and time.  Psychiatric:        Mood and Affect: Mood normal.        Behavior: Behavior normal.        Thought Content: Thought content normal.        Judgment: Judgment normal.    Assessment & Plan   Problem List Items  Addressed This Visit       Cardiovascular and Mediastinum   Migraine without aura and without status migrainosus, not intractable   Well-managed with no recent episodes and only infrequent breakthrough migraines. - Renew Imitrex  prescription for prn use.      Relevant Medications   SUMAtriptan  (IMITREX ) 50 MG tablet     Respiratory   Mild persistent asthma without complication   Well-controlled with no recent exacerbations. - Ensure albuterol  prescription is up to date.        Other   Abnormal thyroid function test   Prior TSH low with normal free T4 not indicative of treatment. No current concerning symptoms.  - Rechecking TSH and free T4.       Relevant Orders   TSH   T4, free   GAD (generalized anxiety disorder) - Primary   Managed with escitalopram  10 mg daily. No side effects reported. Medication effective. Prefers current dose. Considering counseling. Discussed FMLA paperwork for stress-related leave. - Continue escitalopram  10 mg daily. - Offer counseling referral if desired. - Discuss FMLA paperwork for stress-related leave.      Other Visit Diagnoses       Encounter for screening mammogram for malignant neoplasm of breast       Relevant Orders   MM DIGITAL SCREENING BILATERAL      Discussed health  maintenance, vaccinations, and screenings. - Order mammogram. - Discuss tetanus vaccine, patient to decide on update. - Discuss pneumonia vaccine, patient to decide on update.  - Ok to schedule as a nurse visit to update vaccines.     Follow up   Return in about 6 months (around 08/01/2024) for annual physical exam or sooner if needed. __________________________________ Zada FREDRIK Palin, DNP, APRN, FNP-BC Primary Care and Sports Medicine [facility] [place]

## 2024-02-02 NOTE — Assessment & Plan Note (Signed)
 Well-managed with no recent episodes and only infrequent breakthrough migraines. - Renew Imitrex  prescription for prn use.

## 2024-02-02 NOTE — Assessment & Plan Note (Signed)
 Managed with escitalopram  10 mg daily. No side effects reported. Medication effective. Prefers current dose. Considering counseling. Discussed FMLA paperwork for stress-related leave. - Continue escitalopram  10 mg daily. - Offer counseling referral if desired. - Discuss FMLA paperwork for stress-related leave.

## 2024-02-03 ENCOUNTER — Ambulatory Visit: Payer: Self-pay | Admitting: Medical-Surgical

## 2024-02-03 LAB — T4, FREE: Free T4: 1.36 ng/dL (ref 0.82–1.77)

## 2024-02-03 LAB — TSH: TSH: 0.425 u[IU]/mL — ABNORMAL LOW (ref 0.450–4.500)

## 2024-02-19 ENCOUNTER — Ambulatory Visit

## 2024-02-19 DIAGNOSIS — Z1231 Encounter for screening mammogram for malignant neoplasm of breast: Secondary | ICD-10-CM | POA: Diagnosis not present

## 2024-02-25 ENCOUNTER — Other Ambulatory Visit: Payer: Self-pay | Admitting: Medical-Surgical

## 2024-02-25 DIAGNOSIS — R928 Other abnormal and inconclusive findings on diagnostic imaging of breast: Secondary | ICD-10-CM

## 2024-03-09 ENCOUNTER — Ambulatory Visit
Admission: RE | Admit: 2024-03-09 | Discharge: 2024-03-09 | Disposition: A | Discharge status: Home / Self Care | Source: Ambulatory Visit | Attending: Medical-Surgical | Admitting: Medical-Surgical

## 2024-03-09 ENCOUNTER — Other Ambulatory Visit: Payer: Self-pay | Admitting: Medical-Surgical

## 2024-03-09 ENCOUNTER — Ambulatory Visit: Payer: Self-pay | Admitting: Medical-Surgical

## 2024-03-09 DIAGNOSIS — R928 Other abnormal and inconclusive findings on diagnostic imaging of breast: Secondary | ICD-10-CM

## 2024-03-09 DIAGNOSIS — N6342 Unspecified lump in left breast, subareolar: Secondary | ICD-10-CM

## 2024-03-15 ENCOUNTER — Ambulatory Visit
Admission: RE | Admit: 2024-03-15 | Discharge: 2024-03-15 | Disposition: A | Discharge status: Home / Self Care | Source: Ambulatory Visit | Attending: Medical-Surgical | Admitting: Medical-Surgical

## 2024-03-15 ENCOUNTER — Ambulatory Visit
Admission: RE | Admit: 2024-03-15 | Discharge: 2024-03-15 | Disposition: A | Source: Ambulatory Visit | Attending: Medical-Surgical | Admitting: Medical-Surgical

## 2024-03-15 DIAGNOSIS — N6342 Unspecified lump in left breast, subareolar: Secondary | ICD-10-CM

## 2024-03-15 HISTORY — PX: BREAST BIOPSY: SHX20

## 2024-03-16 LAB — SURGICAL PATHOLOGY

## 2024-05-17 ENCOUNTER — Encounter: Payer: Self-pay | Admitting: Medical-Surgical

## 2024-05-17 ENCOUNTER — Ambulatory Visit: Admitting: Medical-Surgical

## 2024-05-17 VITALS — BP 125/89 | HR 85 | Temp 97.9°F | Resp 20 | Ht 62.0 in | Wt 174.1 lb

## 2024-05-17 DIAGNOSIS — E66811 Obesity, class 1: Secondary | ICD-10-CM | POA: Diagnosis not present

## 2024-05-17 DIAGNOSIS — E6609 Other obesity due to excess calories: Secondary | ICD-10-CM | POA: Diagnosis not present

## 2024-05-17 DIAGNOSIS — Z6831 Body mass index (BMI) 31.0-31.9, adult: Secondary | ICD-10-CM

## 2024-05-17 MED ORDER — ZEPBOUND 2.5 MG/0.5ML ~~LOC~~ SOAJ: 2.5000 mg | SUBCUTANEOUS | 0 refills | Status: AC

## 2024-05-17 NOTE — Progress Notes (Unsigned)
" ° °       Established patient visit   History of Present Illness   Discussed the use of AI scribe software for clinical note transcription with the patient, who gave verbal consent to proceed.  History of Present Illness   Lauren Walsh is a 54 year old female who presents to discuss FMLA and weight management concerns.  Weight gain and adiposity - Progressive weight gain, primarily around the midsection, described as an 'inner tube' - Current BMI 31.84 - History of prior weight loss followed by regain - Attributes recent weight gain to menopause - Strong sugar cravings that are difficult to control - Minimal physical activity due to low energy  Anxiety - Requesting FMLA forms be completed to accommodation time out for severe anxiety flares - Feels that 1-2 episodes/month lasting 1-5 days is sufficient.     Physical Exam   Physical Exam Vitals reviewed.  Constitutional:      General: She is not in acute distress.    Appearance: Normal appearance. She is obese. She is not ill-appearing.  HENT:     Head: Normocephalic and atraumatic.  Cardiovascular:     Rate and Rhythm: Normal rate and regular rhythm.  Pulmonary:     Effort: Pulmonary effort is normal. No respiratory distress.  Skin:    General: Skin is warm and dry.  Neurological:     Mental Status: She is alert and oriented to person, place, and time.  Psychiatric:        Mood and Affect: Mood normal.        Behavior: Behavior normal.        Thought Content: Thought content normal.        Judgment: Judgment normal.    Assessment & Plan   Class 1 obesity BMI 31.84. Weight gain likely due to menopause-related hormonal changes as well as sedentary lifestyle and dietary habits. Discussed GLP-1 receptor agonists available. Insurance coverage is a barrier. Discussed side effects and emphasized exercise and diet.  - Submitted insurance request for Zepbound  2.5mg  weekly under class 1 obesity. - If insurance denies,  consider manufacturer programs or compounding pharmacy options. - Encouraged exercise 2-3 times a week, aiming for 150 minutes of moderate intensity or 75 minutes of high intensity per week. - Advised dietary modifications to reduce sugar intake and avoid junk food.  Anxiety disorder Discussed FMLA for anxiety-related absences. Plan to justify intermittent FMLA for anxiety, allowing up to 1-2 times per month, 1-5 days each. - Completed FMLA paperwork for anxiety, allowing 1-2 times per month, 1-5 days each. [employer] FMLA paperwork to January 1st, 2026.  Follow up   Return if symptoms worsen or fail to improve. __________________________________ Zada FREDRIK Palin, DNP, APRN, FNP-BC Primary Care and Sports Medicine [facility] [place] "

## 2024-05-31 ENCOUNTER — Encounter: Payer: Self-pay | Admitting: Medical-Surgical

## 2024-08-02 ENCOUNTER — Encounter: Admitting: Medical-Surgical
